# Patient Record
Sex: Female | Born: 1990 | Race: White | Hispanic: No | Marital: Married | Smoking: Former smoker
Health system: Southern US, Community
[De-identification: ages and names within clinical notes are randomized; demographics above are authoritative.]

## PROBLEM LIST (undated history)

## (undated) DIAGNOSIS — F419 Anxiety disorder, unspecified: Secondary | ICD-10-CM

## (undated) DIAGNOSIS — Z8489 Family history of other specified conditions: Secondary | ICD-10-CM

## (undated) DIAGNOSIS — O139 Gestational [pregnancy-induced] hypertension without significant proteinuria, unspecified trimester: Secondary | ICD-10-CM

## (undated) DIAGNOSIS — F909 Attention-deficit hyperactivity disorder, unspecified type: Secondary | ICD-10-CM

## (undated) DIAGNOSIS — O24419 Gestational diabetes mellitus in pregnancy, unspecified control: Secondary | ICD-10-CM

## (undated) DIAGNOSIS — K219 Gastro-esophageal reflux disease without esophagitis: Secondary | ICD-10-CM

## (undated) DIAGNOSIS — T8859XA Other complications of anesthesia, initial encounter: Secondary | ICD-10-CM

## (undated) HISTORY — PX: ANKLE SURGERY: SHX546

## (undated) HISTORY — DX: Gestational (pregnancy-induced) hypertension without significant proteinuria, unspecified trimester: O13.9

## (undated) HISTORY — PX: OTHER SURGICAL HISTORY: SHX169

## (undated) HISTORY — PX: TONSILLECTOMY: SUR1361

## (undated) HISTORY — PX: LITHOTRIPSY: SUR834

## (undated) HISTORY — DX: Other complications of anesthesia, initial encounter: T88.59XA

---
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
## 2010-07-01 ENCOUNTER — Emergency Department (HOSPITAL_COMMUNITY): Admission: EM | Admit: 2010-07-01 | Discharge: 2010-07-01 | Payer: Self-pay | Admitting: Emergency Medicine

## 2011-02-03 ENCOUNTER — Emergency Department (HOSPITAL_COMMUNITY)
Admission: EM | Admit: 2011-02-03 | Discharge: 2011-02-03 | Disposition: A | Discharge status: Home / Self Care | Payer: Medicaid Other | Attending: Emergency Medicine | Admitting: Emergency Medicine

## 2011-02-03 DIAGNOSIS — R109 Unspecified abdominal pain: Secondary | ICD-10-CM | POA: Insufficient documentation

## 2011-02-03 DIAGNOSIS — R197 Diarrhea, unspecified: Secondary | ICD-10-CM | POA: Insufficient documentation

## 2011-02-03 LAB — DIFFERENTIAL
Basophils Relative: 0 % (ref 0–1)
Eosinophils Relative: 1 % (ref 0–5)
Lymphocytes Relative: 28 % (ref 12–46)
Lymphs Abs: 2.2 10*3/uL (ref 0.7–4.0)
Monocytes Absolute: 0.4 10*3/uL (ref 0.1–1.0)
Monocytes Relative: 5 % (ref 3–12)
Neutro Abs: 5.2 10*3/uL (ref 1.7–7.7)

## 2011-02-03 LAB — URINE MICROSCOPIC-ADD ON

## 2011-02-03 LAB — COMPREHENSIVE METABOLIC PANEL
ALT: 14 U/L (ref 0–35)
AST: 19 U/L (ref 0–37)
BUN: 12 mg/dL (ref 6–23)
GFR calc non Af Amer: >60 mL/min (ref >60)
Glucose, Bld: 80 mg/dL (ref 70–99)
Total Protein: 7 g/dL (ref 6.0–8.3)

## 2011-02-03 LAB — URINALYSIS, ROUTINE W REFLEX MICROSCOPIC
Glucose, UA: NEGATIVE mg/dL
Ketones, ur: 15 mg/dL — AB
Leukocytes, UA: NEGATIVE
Nitrite: NEGATIVE
Protein, ur: NEGATIVE mg/dL
Urobilinogen, UA: 0.2 mg/dL (ref 0.0–1.0)

## 2011-02-03 LAB — CBC
HCT: 39.6 % (ref 36.0–46.0)
MCH: 27.8 pg (ref 26.0–34.0)
MCHC: 33.6 g/dL (ref 30.0–36.0)
MCV: 82.8 fL (ref 78.0–100.0)
Platelets: 272 10*3/uL (ref 150–400)
RDW: 12.5 % (ref 11.5–15.5)

## 2011-03-13 NOTE — Op Note (Signed)
NAMEMarland Kitchen  PAMLA, PANGLE            ACCOUNT NO.:  [id number]   MEDICAL RECORD NO.:  [id number]          PATIENT TYPE:  AMB   LOCATION:  DSC                          FACILITY:  [facility]   PHYSICIAN:  Alfonse Flavors, M.D.    DATE OF BIRTH:  [birth year]   DATE OF PROCEDURE:  05/13/2006  DATE OF DISCHARGE:                                 OPERATIVE REPORT   INDICATION AND JUSTIFICATION FOR PROCEDURE:  Jennifer Hebert is a 20-year-  old patient who was seen in consultation at the request of Dr. Shanon Brow  at [facility]  Jennifer Hebert had a history of airway obstruction at  night.  She had been noted to have loud snoring.  Her mother noted  intermittent apneic periods.  Jennifer Hebert awakens several times at night with a  choking sensation.  She has strep throat about twice a year.  Initial  physical examination showed 4+ tonsils.  The soft palate moved well.  The  neck had enlarged jugulodigastric lymph nodes.  Jennifer Hebert was felt to have  tonsil and adenoid hypertrophy with significant nocturnal airway  obstruction, and she was a candidate for tonsillectomy and adenoidectomy.  The indications and complications of the procedure were discussed in detail  with her mother, and our pre-permit was obtained.   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   PROCEDURE PERFORMED:  Tonsillectomy and adenoidectomy.   ANESTHESIA:  General endotracheal.   DESCRIPTION:  Jennifer Hebert was brought to the operating room and placed supine on  the operating table.  She was induced with general anesthesia and intubated  with an orotracheal tube.  The face was draped in a sterile fashion.  The  mouth was opened with a Crowe-Davis mouthgag, and the left tonsil was  grasped with a tenaculum and retracted medially.  An incision was made over  the anterior tonsillar pillar using suction cautery.   Using a curved Dean  knife, curved clamp and suction cautery, the tonsil was dissected free from  the tonsillar fossa.  Similar technique was used for removal of the right  tonsil.  The tonsillar fossae were abraded with a Pension scheme manager.  Further bleeding areas were cauterized again.  Marcaine 0.5% with 1:200,000  epinephrine was injected circumferentially around the tonsillar fossae; a  total of 3 mL of solution was used.  The palate was elevated with the red  rubber catheter.  Under visualization by mirror, a moderate adenoid was  removed with adenotome and suction cautery.  Hemostasis was obtained with  suction cautery.  The pharynx was suctioned free of debris.  A small  nasogastric tube was passed into the stomach, and the gastric contents were  evacuated.  Jennifer Hebert tolerated the procedure well and was taken to the  recovery area in satisfactory condition.   FOLLOWUP CARE:  Jennifer Hebert will be admitted to the recovery care unit for  observation, IV hydration, and IV analgesia.  If she does not have  significant nausea and is taking oral fluids well, she may be discharged  this  evening.  If she is not maintaining good oral hydration, she would be  observed overnight.  Discharge medications will include [employer] with codeine  and amoxicillin.  Jennifer Hebert will be seen for reevaluation in our office on  May 27, 2006.           ______________________________  Alfonse Flavors, M.D.     JCM/MEDQ  D:  05/13/2006  T:  05/13/2006  Job:  [id number]   cc:   Dr. [facility]

## 2011-08-03 ENCOUNTER — Other Ambulatory Visit: Payer: Self-pay | Admitting: Dermatology

## 2012-01-02 ENCOUNTER — Ambulatory Visit (INDEPENDENT_AMBULATORY_CARE_PROVIDER_SITE_OTHER): Payer: BC Managed Care – PPO | Admitting: Family Medicine

## 2012-01-02 DIAGNOSIS — B349 Viral infection, unspecified: Secondary | ICD-10-CM

## 2012-01-02 DIAGNOSIS — R69 Illness, unspecified: Secondary | ICD-10-CM

## 2012-01-02 DIAGNOSIS — J029 Acute pharyngitis, unspecified: Secondary | ICD-10-CM

## 2012-01-02 LAB — POCT CBC
Lymph, poc: 1.2 (ref 0.6–3.4)
MCH, POC: 27.7 pg (ref 27–31.2)
MCHC: 32.1 g/dL (ref 31.8–35.4)
MID (cbc): 0.4 (ref 0–0.9)
MPV: 9.4 fL (ref 0–99.8)
POC Granulocyte: 3.3 (ref 2–6.9)
POC LYMPH PERCENT: 24.4 %L (ref 10–50)
RBC: 4.87 M/uL (ref 4.04–5.48)
WBC: 4.9 10*3/uL (ref 4.6–10.2)

## 2012-01-02 LAB — POCT RAPID STREP A (OFFICE): Rapid Strep A Screen: NEGATIVE

## 2012-01-02 MED ORDER — HYDROCODONE-HOMATROPINE 5-1.5 MG/5ML PO SYRP: 5.0000 mL | ORAL_SOLUTION | Freq: Three times a day (TID) | ORAL | Status: AC | PRN

## 2012-01-02 NOTE — Patient Instructions (Addendum)
Drink lots of fluids and get plenty of rest. Take the cough syrup as ordered. If not doing better by the middle of the week we may need to antibiotic.   Viral Pharyngitis Viral pharyngitis is a viral infection that produces redness, pain, and swelling (inflammation) of the throat. It can spread from person to person (contagious). CAUSES Viral pharyngitis is caused by inhaling a large amount of certain germs called viruses. Many different viruses cause viral pharyngitis. SYMPTOMS Symptoms of viral pharyngitis include:  Sore throat.   Tiredness.   Stuffy nose.   Low-grade fever.   Congestion.   Cough.  TREATMENT Treatment includes rest, drinking plenty of fluids, and the use of over-the-counter medication (approved by your caregiver). HOME CARE INSTRUCTIONS   Drink enough fluids to keep your urine clear or pale yellow.   Eat soft, cold foods such as ice cream, frozen ice pops, or gelatin dessert.   Gargle with warm salt water (1 tsp salt per 1 qt of water).   If over age 29, throat lozenges may be used safely.   Only take over-the-counter or prescription medicines for pain, discomfort, or fever as directed by your caregiver. Do not take aspirin.  To help prevent spreading viral pharyngitis to others, avoid:  Mouth-to-mouth contact with others.   Sharing utensils for eating and drinking.   Coughing around others.  SEEK MEDICAL CARE IF:   You are better in a few days, then become worse.   You have a fever or pain not helped by pain medicines.   There are any other changes that concern you.  Document Released: 07/22/2005 Document Revised: 10/01/2011 Document Reviewed: 12/18/2010 [facility] Patient Information 2012 [place], [place].

## 2012-01-02 NOTE — Progress Notes (Signed)
Subjective: Patient reports having gotten ill on Thursday she had sore throat and bodyaches and fever over the last 2 days . Actually she did better on Friday. Today her fever is higher he can she has a headache, neck pain, back pain, sore throat, and cough. 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  She had a lot of respiratory tract infections when she was younger, but after tonsillectomy at about age 21 she has not had many such illnesses. The last one was one year ago when she had strep. She does smoke about a pack of cigarettes per week.  Objective: Cheeks are flushed. TMs normal. Throat erythematous without exudate. Neck supple without significant nodes. Chest is clear to auscultation. Heart regular without murmurs.  Assessment: Pharyngitis and cough  Plan: Check labs and proceed accordingly.  Results for orders placed in visit on 01/02/12  POCT CBC      Component Value Range   WBC 4.9  4.6 - 10.2 (K/uL)   Lymph, poc 1.2  0.6 - 3.4    POC LYMPH PERCENT 24.4  10 - 50 (%L)   MID (cbc) 0.4  0 - 0.9    POC MID % 8.3  0 - 12 (%M)   POC Granulocyte 3.3  2 - 6.9    Granulocyte percent 67.3  37 - 80 (%G)   RBC 4.87  4.04 - 5.48 (M/uL)   Hemoglobin 13.5  12.2 - 16.2 (g/dL)   HCT, POC 40.9  81.1 - 47.9 (%)   MCV 86.3  80 - 97 (fL)   MCH, POC 27.7  27 - 31.2 (pg)   MCHC 32.1  31.8 - 35.4 (g/dL)   RDW, POC 91.4     Platelet Count, POC 297  142 - 424 (K/uL)   MPV 9.4  0 - 99.8 (fL)  POCT RAPID STREP A (OFFICE)      Component Value Range   Rapid Strep A Screen Negative  Negative   POCT INFLUENZA A/B      Component Value Range   Influenza A, POC Negative     Influenza B, POC Negative     viral syndromes  Treatment symptomatic

## 2012-05-27 ENCOUNTER — Other Ambulatory Visit: Payer: Self-pay | Admitting: Dermatology

## 2012-10-11 ENCOUNTER — Other Ambulatory Visit: Payer: Self-pay | Admitting: Dermatology

## 2012-10-26 DIAGNOSIS — Z8619 Personal history of other infectious and parasitic diseases: Secondary | ICD-10-CM

## 2012-10-26 DIAGNOSIS — Z8742 Personal history of other diseases of the female genital tract: Secondary | ICD-10-CM

## 2012-10-26 HISTORY — DX: Personal history of other infectious and parasitic diseases: Z86.19

## 2012-10-26 HISTORY — DX: Personal history of other diseases of the female genital tract: Z87.42

## 2013-10-24 ENCOUNTER — Other Ambulatory Visit: Payer: Self-pay | Admitting: Dermatology

## 2015-09-25 ENCOUNTER — Emergency Department (HOSPITAL_COMMUNITY)
Admission: EM | Admit: 2015-09-25 | Discharge: 2015-09-26 | Disposition: A | Discharge status: Home / Self Care | Payer: BLUE CROSS/BLUE SHIELD | Attending: Emergency Medicine | Admitting: Emergency Medicine

## 2015-09-25 ENCOUNTER — Encounter (HOSPITAL_COMMUNITY): Payer: Self-pay | Admitting: Emergency Medicine

## 2015-09-25 DIAGNOSIS — Z79899 Other long term (current) drug therapy: Secondary | ICD-10-CM | POA: Insufficient documentation

## 2015-09-25 DIAGNOSIS — R21 Rash and other nonspecific skin eruption: Secondary | ICD-10-CM | POA: Diagnosis present

## 2015-09-25 DIAGNOSIS — L509 Urticaria, unspecified: Secondary | ICD-10-CM | POA: Insufficient documentation

## 2015-09-25 NOTE — ED Notes (Signed)
Pt states that she started breaking out into a rash tonight. Thought she might be allergic to a new shampoo. Took benadryl at approx. X 1 hour ago. Alert and oriented. Airway intact.

## 2015-09-26 MED ORDER — PREDNISONE 10 MG PO TABS: ORAL_TABLET | ORAL | Status: DC

## 2015-09-26 MED ORDER — PREDNISONE 20 MG PO TABS
60.0000 mg | ORAL_TABLET | Freq: Once | ORAL | Status: AC
  Administered 2015-09-26: 60 mg via ORAL
  Filled 2015-09-26: qty 3

## 2015-09-26 MED ORDER — DIPHENHYDRAMINE HCL 25 MG PO TABS: 25.0000 mg | ORAL_TABLET | Freq: Four times a day (QID) | ORAL | Status: DC

## 2015-09-26 MED ORDER — FAMOTIDINE 20 MG PO TABS
20.0000 mg | ORAL_TABLET | Freq: Once | ORAL | Status: AC
  Administered 2015-09-26: 20 mg via ORAL
  Filled 2015-09-26: qty 1

## 2015-09-26 MED ORDER — FAMOTIDINE 20 MG PO TABS: 20.0000 mg | ORAL_TABLET | Freq: Two times a day (BID) | ORAL | Status: DC

## 2015-09-26 NOTE — Discharge Instructions (Signed)
Hives Hives are itchy, red, swollen areas of the skin. They can vary in size and location on your body. Hives can come and go for hours or several days (acute hives) or for several weeks (chronic hives). Hives do not spread from person to person (noncontagious). They may get worse with scratching, exercise, and emotional stress. CAUSES   Allergic reaction to food, additives, or drugs.  Infections, including the common cold.  Illness, such as vasculitis, lupus, or thyroid disease.  Exposure to sunlight, heat, or cold.  Exercise.  Stress.  Contact with chemicals. SYMPTOMS   Red or white swollen patches on the skin. The patches may change size, shape, and location quickly and repeatedly.  Itching.  Swelling of the hands, feet, and face. This may occur if hives develop deeper in the skin. DIAGNOSIS  Your caregiver can usually tell what is wrong by performing a physical exam. Skin or blood tests may also be done to determine the cause of your hives. In some cases, the cause cannot be determined. TREATMENT  Mild cases usually get better with medicines such as antihistamines. Severe cases may require an emergency epinephrine injection. If the cause of your hives is known, treatment includes avoiding that trigger.  HOME CARE INSTRUCTIONS   Avoid causes that trigger your hives.  Take antihistamines as directed by your caregiver to reduce the severity of your hives. Non-sedating or low-sedating antihistamines are usually recommended. Do not drive while taking an antihistamine.  Take any other medicines prescribed for itching as directed by your caregiver.  Wear loose-fitting clothing.  Keep all follow-up appointments as directed by your caregiver. SEEK MEDICAL CARE IF:   You have persistent or severe itching that is not relieved with medicine.  You have painful or swollen joints. SEEK IMMEDIATE MEDICAL CARE IF:   You have a fever.  Your tongue or lips are swollen.  You have  trouble breathing or swallowing.  You feel tightness in the throat or chest.  You have abdominal pain. These problems may be the first sign of a life-threatening allergic reaction. Call your local emergency services (911 in [place] MAKE SURE YOU:   Understand these instructions.  Will watch your condition.  Will get help right away if you are not doing well or get worse.   This information is not intended to replace advice given to you by your health care provider. Make sure you discuss any questions you have with your health care provider.   Document Released: 10/12/2005 Document Revised: 10/17/2013 Document Reviewed: 01/05/2012 Elsevier Interactive Patient Education 2016 Elsevier Inc.  

## 2015-09-26 NOTE — ED Provider Notes (Signed)
CSN: [id number]     Arrival date & time 09/25/15  2349 History   First MD Initiated Contact with Patient 09/26/15 0001     Chief Complaint  Patient presents with  . Urticaria     (Consider location/radiation/quality/duration/timing/severity/associated sxs/prior Treatment) Patient is a 24 y.o. female presenting with urticaria. The history is provided by the patient. No language interpreter was used.  Urticaria This is a new problem. The current episode started today. Associated symptoms include a rash. Pertinent negatives include no chest pain, coughing, fever or nausea. Associated symptoms comments: Onset of generalized, itching rash earlier today while at work. No new environments, change in soap or detergents, new medications.  No SOB or difficulty swallowing. No history of allergic reactions in the past..    No past medical history on file. Past Surgical History  Procedure Laterality Date  . Ankle surgery Right   . Tonsillectomy     No family history on file. Social History  Substance Use Topics  . Smoking status: Never Smoker   . Smokeless tobacco: Not on file  . Alcohol Use: Yes   OB History    No data available     Review of Systems  Constitutional: Negative for fever.  HENT: Negative for facial swelling and trouble swallowing.   Respiratory: Negative for cough and wheezing.   Cardiovascular: Negative for chest pain.  Gastrointestinal: Negative for nausea.  Skin: Positive for rash.  Neurological: Negative for dizziness and light-headedness.      Allergies  Review of patient's allergies indicates no known allergies.  Home Medications   Prior to Admission medications   Medication Sig Start Date End Date Taking? Authorizing Provider  cloNIDine (CATAPRES) 0.1 MG tablet Take 0.1 mg by mouth as needed.    Historical Provider, MD  diphenhydrAMINE (BENADRYL) 25 MG tablet Take 1 tablet (25 mg total) by mouth every 6 (six) hours. 09/26/15   Elpidio Anis, PA-C   etonogestrel (NEXPLANON) 68 MG IMPL implant Inject 1 each into the skin once.    Historical Provider, MD  famotidine (PEPCID) 20 MG tablet Take 1 tablet (20 mg total) by mouth 2 (two) times daily. 09/26/15   Elpidio Anis, PA-C  lisdexamfetamine (VYVANSE) 60 MG capsule Take 60 mg by mouth every morning.    Historical Provider, MD  predniSONE (DELTASONE) 10 MG tablet Take 5 on day 2 (day one provided in the emergency department) Take 4 on day 3 Take 3 on day 4 Take 2 on day 5 Take 1 on day 6 09/26/15   Melvenia Beam Dayanara Sherrill, PA-C   BP 163/89 mmHg  Pulse 104  Temp(Src) 98.1 F (36.7 C) (Oral)  SpO2 98%  LMP 09/20/2015 Physical Exam  Constitutional: She is oriented to person, place, and time. She appears well-developed and well-nourished.  HENT:  Head: Normocephalic.  Lips without swelling.  Neck: Normal range of motion. Neck supple.  Cardiovascular: Normal rate and regular rhythm.   Pulmonary/Chest: Effort normal and breath sounds normal. She has no wheezes. She has no rales.  Abdominal: Soft. Bowel sounds are normal. There is no tenderness. There is no rebound and no guarding.  Musculoskeletal: Normal range of motion.  Neurological: She is alert and oriented to person, place, and time.  Skin: Skin is warm and dry. No rash noted.  Raised, confluent, erythematous rash in generalized distribution, c/w hives.  Psychiatric: She has a normal mood and affect.    ED Course  Procedures (including critical care time) Labs Review Labs Reviewed - No data  to display  Imaging Review No results found. I have personally reviewed and evaluated these images and lab results as part of my medical decision-making.   EKG Interpretation None      MDM   Final diagnoses:  Hives    Uncomplicated hives requiring medications. Return precautions discussed. Dosing of Benadryl also discussed.     Elpidio AnisShari Mackensie Pilson, PA-C 09/26/15 0040  Tomasita CrumbleAdeleke Oni, MD 09/26/15 [phone]

## 2016-03-13 ENCOUNTER — Emergency Department (HOSPITAL_COMMUNITY): Payer: BLUE CROSS/BLUE SHIELD

## 2016-03-13 ENCOUNTER — Ambulatory Visit (INDEPENDENT_AMBULATORY_CARE_PROVIDER_SITE_OTHER): Payer: BLUE CROSS/BLUE SHIELD

## 2016-03-13 ENCOUNTER — Emergency Department (HOSPITAL_COMMUNITY)
Admission: EM | Admit: 2016-03-13 | Discharge: 2016-03-13 | Disposition: A | Discharge status: Home / Self Care | Payer: BLUE CROSS/BLUE SHIELD | Attending: Emergency Medicine | Admitting: Emergency Medicine

## 2016-03-13 ENCOUNTER — Ambulatory Visit (INDEPENDENT_AMBULATORY_CARE_PROVIDER_SITE_OTHER): Payer: BLUE CROSS/BLUE SHIELD | Admitting: Physician Assistant

## 2016-03-13 ENCOUNTER — Encounter (HOSPITAL_COMMUNITY): Payer: Self-pay | Admitting: Emergency Medicine

## 2016-03-13 VITALS — BP 152/88 | HR 89 | Temp 98.6°F | Ht 65.0 in | Wt 218.0 lb

## 2016-03-13 DIAGNOSIS — R109 Unspecified abdominal pain: Secondary | ICD-10-CM | POA: Insufficient documentation

## 2016-03-13 DIAGNOSIS — N200 Calculus of kidney: Secondary | ICD-10-CM

## 2016-03-13 DIAGNOSIS — N12 Tubulo-interstitial nephritis, not specified as acute or chronic: Secondary | ICD-10-CM

## 2016-03-13 DIAGNOSIS — Z79899 Other long term (current) drug therapy: Secondary | ICD-10-CM | POA: Diagnosis not present

## 2016-03-13 DIAGNOSIS — Z87891 Personal history of nicotine dependence: Secondary | ICD-10-CM | POA: Insufficient documentation

## 2016-03-13 DIAGNOSIS — N23 Unspecified renal colic: Secondary | ICD-10-CM

## 2016-03-13 LAB — URINALYSIS, ROUTINE W REFLEX MICROSCOPIC
Bilirubin Urine: NEGATIVE
GLUCOSE, UA: NEGATIVE mg/dL
KETONES UR: 15 mg/dL — AB
NITRITE: NEGATIVE
PH: 6 (ref 5.0–8.0)
Protein, ur: 100 mg/dL — AB
SPECIFIC GRAVITY, URINE: 1.031 — AB (ref 1.005–1.030)

## 2016-03-13 LAB — POCT URINALYSIS DIP (MANUAL ENTRY)
Bilirubin, UA: NEGATIVE
Glucose, UA: NEGATIVE
NITRITE UA: NEGATIVE
PH UA: 5.5
Spec Grav, UA: >=1.03
Urobilinogen, UA: 0.2

## 2016-03-13 LAB — CBC WITH DIFFERENTIAL/PLATELET
BASOS ABS: 0 10*3/uL (ref 0.0–0.1)
BASOS PCT: 0 %
EOS ABS: 0.1 10*3/uL (ref 0.0–0.7)
Eosinophils Relative: 1 %
HCT: 37.2 % (ref 36.0–46.0)
HEMOGLOBIN: 12.9 g/dL (ref 12.0–15.0)
Lymphocytes Relative: 36 %
Lymphs Abs: 3.3 10*3/uL (ref 0.7–4.0)
MCH: 28.7 pg (ref 26.0–34.0)
MCHC: 34.7 g/dL (ref 30.0–36.0)
MCV: 82.9 fL (ref 78.0–100.0)
Monocytes Absolute: 0.4 10*3/uL (ref 0.1–1.0)
Monocytes Relative: 5 %
NEUTROS PCT: 58 %
Neutro Abs: 5.4 10*3/uL (ref 1.7–7.7)
Platelets: 278 10*3/uL (ref 150–400)
RBC: 4.49 MIL/uL (ref 3.87–5.11)
RDW: 12.8 % (ref 11.5–15.5)
WBC: 9.3 10*3/uL (ref 4.0–10.5)

## 2016-03-13 LAB — POCT CBC
Granulocyte percent: 70.4 %G (ref 37–80)
HCT, POC: 37.6 % — AB (ref 37.7–47.9)
HEMOGLOBIN: 13.5 g/dL (ref 12.2–16.2)
LYMPH, POC: 2.5 (ref 0.6–3.4)
MCH, POC: 29.8 pg (ref 27–31.2)
MCHC: 35.9 g/dL — AB (ref 31.8–35.4)
MCV: 83 fL (ref 80–97)
MID (cbc): 0.3 (ref 0–0.9)
MPV: 7.9 fL (ref 0–99.8)
POC GRANULOCYTE: 6.8 (ref 2–6.9)
POC LYMPH PERCENT: 26.1 %L (ref 10–50)
POC MID %: 3.5 %M (ref 0–12)
Platelet Count, POC: 271 10*3/uL (ref 142–424)
RBC: 4.53 M/uL (ref 4.04–5.48)
RDW, POC: 13.1 %
WBC: 9.6 10*3/uL (ref 4.6–10.2)

## 2016-03-13 LAB — BASIC METABOLIC PANEL
Anion gap: 7 (ref 5–15)
BUN: 13 mg/dL (ref 6–20)
CHLORIDE: 112 mmol/L — AB (ref 101–111)
CO2: 21 mmol/L — ABNORMAL LOW (ref 22–32)
CREATININE: 0.63 mg/dL (ref 0.44–1.00)
Calcium: 8.8 mg/dL — ABNORMAL LOW (ref 8.9–10.3)
GFR calc non Af Amer: >60 mL/min (ref >60)
Glucose, Bld: 91 mg/dL (ref 65–99)
POTASSIUM: 3.8 mmol/L (ref 3.5–5.1)
SODIUM: 140 mmol/L (ref 135–145)

## 2016-03-13 LAB — URINE MICROSCOPIC-ADD ON

## 2016-03-13 LAB — POCT URINE PREGNANCY: Preg Test, Ur: NEGATIVE

## 2016-03-13 LAB — I-STAT BETA HCG BLOOD, ED (MC, WL, AP ONLY): I-stat hCG, quantitative: <5 m[IU]/mL (ref <5)

## 2016-03-13 MED ORDER — CEPHALEXIN 500 MG PO CAPS: 500.0000 mg | ORAL_CAPSULE | Freq: Three times a day (TID) | ORAL | Status: DC

## 2016-03-13 MED ORDER — MORPHINE SULFATE (PF) 4 MG/ML IV SOLN
4.0000 mg | Freq: Once | INTRAVENOUS | Status: AC
  Administered 2016-03-13: 4 mg via INTRAVENOUS
  Filled 2016-03-13: qty 1

## 2016-03-13 MED ORDER — HYDROCODONE-ACETAMINOPHEN 5-325 MG PO TABS: 1.0000 | ORAL_TABLET | Freq: Four times a day (QID) | ORAL | Status: DC | PRN

## 2016-03-13 MED ORDER — SODIUM CHLORIDE 0.9 % IV BOLUS (SEPSIS)
1000.0000 mL | Freq: Once | INTRAVENOUS | Status: AC
  Administered 2016-03-13: 1000 mL via INTRAVENOUS

## 2016-03-13 MED ORDER — ONDANSETRON HCL 4 MG/2ML IJ SOLN
4.0000 mg | Freq: Once | INTRAMUSCULAR | Status: AC
  Administered 2016-03-13: 4 mg via INTRAVENOUS
  Filled 2016-03-13: qty 2

## 2016-03-13 MED ORDER — KETOROLAC TROMETHAMINE 60 MG/2ML IM SOLN
60.0000 mg | Freq: Once | INTRAMUSCULAR | Status: AC
  Administered 2016-03-13: 60 mg via INTRAMUSCULAR

## 2016-03-13 MED ORDER — ONDANSETRON 4 MG PO TBDP: ORAL_TABLET | ORAL | Status: DC

## 2016-03-13 MED ORDER — DEXTROSE 5 % IV SOLN
1.0000 g | Freq: Once | INTRAVENOUS | Status: AC
  Administered 2016-03-13: 1 g via INTRAVENOUS
  Filled 2016-03-13: qty 10

## 2016-03-13 NOTE — Patient Instructions (Addendum)
Report to [facility] ED.  Nothing to eat or drink.    IF you received an x-ray today, you will receive an invoice from [facility]. Please contact [facility] at [phone] with questions or concerns regarding your invoice.   IF you received labwork today, you will receive an invoice from [facility]. Please contact [facility] at [phone] with questions or concerns regarding your invoice.   Our billing staff will not be able to assist you with questions regarding bills from these companies.  You will be contacted with the lab results as soon as they are available. The fastest way to get your results is to activate your My Chart account. Instructions are located on the last page of this paperwork. If you have not heard from us regarding the results in 2 weeks, please contact this office.

## 2016-03-13 NOTE — ED Notes (Signed)
[facility] called to notify of a 23 mm Kidney stone right coming via private vehicle

## 2016-03-13 NOTE — Progress Notes (Signed)
03/13/2016 4:56 PM   DOB: 07/04/1991 / MRN: [id number]  SUBJECTIVE:  Jennifer Hebert is a 25 y.o. female presenting for right sided flank pain that has been present for roughly 1 year but worse in the last 4 days.  Reports the pain has been so severe that she has thrown up twice.  Movement does make the pain worse.  She denies dysuria, urgency, frequency, and is moving her bowels normally. She denies a history of abdominal surgery.   She has been worked up for this in the past and no problem was found, and work up included GYN.   She has No Known Allergies.   She  has no past medical history on file.    She  reports that she quit smoking about 8 months ago. She has never used smokeless tobacco. She reports that she drinks alcohol. She reports that she does not use illicit drugs. She  has no sexual activity history on file. The patient  has past surgical history that includes Ankle surgery (Right) and Tonsillectomy.  Her family history is not on file.  Review of Systems  Constitutional: Negative for fever and chills.  Eyes: Negative for blurred vision.  Respiratory: Negative for cough and shortness of breath.   Cardiovascular: Negative for chest pain.  Gastrointestinal: Positive for nausea, vomiting and abdominal pain.  Genitourinary: Negative for dysuria, urgency and frequency.  Musculoskeletal: Negative for myalgias.  Skin: Negative for rash.  Neurological: Negative for dizziness, tingling and headaches.  Psychiatric/Behavioral: Negative for depression. The patient is not nervous/anxious.     Problem list and medications reviewed and updated by myself where necessary, and exist elsewhere in the encounter.   OBJECTIVE:  BP 152/88 mmHg  Pulse 89  Temp(Src) 98.6 F (37 C) (Oral)  Ht  (1.651 m)  Wt 218 lb (98.884 kg)  BMI 36.28 kg/m2  SpO2 97%  LMP 02/03/2016 (Exact Date)  Physical Exam  Constitutional: She is oriented to person, place, and time. She appears  well-nourished. No distress.  Eyes: EOM are normal. Pupils are equal, round, and reactive to light.  Cardiovascular: Normal rate.   Pulmonary/Chest: Effort normal.  Abdominal: She exhibits no distension and no mass. There is tenderness (left upper and lower quadrant). There is no rebound, no guarding and no CVA tenderness.  Musculoskeletal:       Back:  Neurological: She is alert and oriented to person, place, and time. She has normal strength. No cranial nerve deficit or sensory deficit. Coordination and gait normal.  Reflex Scores:      Patellar reflexes are 2+ on the right side and 2+ on the left side. Skin: Skin is dry. She is not diaphoretic.  Psychiatric: She has a normal mood and affect.  Vitals reviewed.   Results for orders placed or performed in visit on 03/13/16 (from the past 72 hour(s))  POCT CBC     Status: Abnormal   Collection Time: 03/13/16  2:02 PM  Result Value Ref Range   WBC 9.6 4.6 - 10.2 K/uL   Lymph, poc 2.5 0.6 - 3.4   POC LYMPH PERCENT 26.1 10 - 50 %L   MID (cbc) 0.3 0 - 0.9   POC MID % 3.5 0 - 12 %M   POC Granulocyte 6.8 2 - 6.9   Granulocyte percent 70.4 37 - 80 %G   RBC 4.53 4.04 - 5.48 M/uL   Hemoglobin 13.5 12.2 - 16.2 g/dL   HCT, POC 78.2 (A) 95.6 - 47.9 %  MCV 83.0 80 - 97 fL   MCH, POC 29.8 27 - 31.2 pg   MCHC 35.9 (A) 31.8 - 35.4 g/dL   RDW, POC 16.113.1 %   Platelet Count, POC 271 142 - 424 K/uL   MPV 7.9 0 - 99.8 fL  POCT urinalysis dipstick     Status: Abnormal   Collection Time: 03/13/16  2:12 PM  Result Value Ref Range   Color, UA yellow yellow   Clarity, UA cloudy (A) clear   Glucose, UA negative negative   Bilirubin, UA negative negative   Ketones, POC UA trace (5) (A) negative   Spec Grav, UA >=1.030    Blood, UA moderate (A) negative   pH, UA 5.5    Protein Ur, POC >=300 (A) negative   Urobilinogen, UA 0.2    Nitrite, UA Negative Negative   Leukocytes, UA small (1+) (A) Negative  POCT urine pregnancy     Status: None    Collection Time: 03/13/16  2:12 PM  Result Value Ref Range   Preg Test, Ur Negative Negative    Dg Abd 2 Views  03/13/2016  CLINICAL DATA:  RIGHT flank pain 4 days. EXAM: ABDOMEN - 2 VIEW COMPARISON:  None. FINDINGS: There is a large calcification projecting over the expected location of the RIGHT renal hilum measuring 23 mm. No ureterolithiasis or bladder calculi. Normal bowel-gas pattern IMPRESSION: Suspect large RIGHT renal pelvis calculus. These results will be called to the ordering clinician or representative by the Radiologist Assistant, and communication documented in the PACS or zVision Dashboard. Electronically Signed   By: Genevive BiStewart  Edmunds M.D.   On: 03/13/2016 14:17    Wt Readings from Last 3 Encounters:  03/13/16 218 lb (98.884 kg)  01/02/12 191 lb (86.637 kg)     ASSESSMENT AND PLAN  Jennifer Hebert was seen today for flank pain and nausea.  Diagnoses and all orders for this visit:  Flank pain: She has a very large stone about the right renal hilum.  There is blood in her urine. She has had multiple episodes of emesis.  I have contacted urology who advised that she present to the ED for further imaging and management. We have given her 1.5 liters of NS and 60 mg of Toradol IM.  Will send [facility] to [facility] for further eval.  -     POCT CBC -     POCT urinalysis dipstick -     DG Abd 2 Views; Future -     POCT urine pregnancy    The patient was advised to call or return to clinic if she does not see an improvement in symptoms or to seek the care of the closest emergency department if she worsens with the above plan.   Deliah BostonMichael Ernesha Ramone, MHS, PA-C [facility] 03/13/2016 4:56 PM

## 2016-03-13 NOTE — Discharge Instructions (Signed)
Stay hydrated.   Take zofran for nausea.   Take vicodin for severe pain. Do not drive with it.   Take motrin 800 mg every 6 hrs for mild pain.   Take keflex three times daily for a week.   See your doctor   Return to ER if you have worse pain, vomiting, fevers.

## 2016-03-13 NOTE — ED Provider Notes (Addendum)
CSN: [id number]     Arrival date & time 03/13/16  1554 History   First MD Initiated Contact with Patient 03/13/16 1846     Chief Complaint  Patient presents with  . Nephrolithiasis     (Consider location/radiation/quality/duration/timing/severity/associated sxs/prior Treatment) The history is provided by the patient.  Lelon HuhConnor B Moroz is a 25 y.o. female history of kidney stones here presenting with right flank pain. Patient has right flank pain intermittently for the last year or so. [place] to school in [place] and came home for the summer. She works in a [employer] relations [employer] and has been out in the sun for the last several days. She had an episode of vomiting yesterday and today. She went to urgent care today and had an x-ray that showed possible 23 mm R kidney stone and sent for evaluation. Denies any gross hematuria or dysuria or vaginal discharge. Has history of kidney stone but has no previous surgery for it.     History reviewed. No pertinent past medical history. Past Surgical History  Procedure Laterality Date  . Ankle surgery Right   . Tonsillectomy     History reviewed. No pertinent family history. Social History  Substance Use Topics  . Smoking status: Former Smoker    Quit date: 07/01/2015  . Smokeless tobacco: Never Used  . Alcohol Use: 0.0 oz/week    0 Standard drinks or equivalent per week   OB History    No data available     Review of Systems  Genitourinary: Positive for flank pain.  All other systems reviewed and are negative.     Allergies  Review of patient's allergies indicates no known allergies.  Home Medications   Prior to Admission medications   Medication Sig Start Date End Date Taking? Authorizing Provider  Ibuprofen-Diphenhydramine HCl (ADVIL PM) 200-25 MG CAPS Take 1 capsule by mouth at bedtime as needed (pain).   Yes Historical Provider, MD  JUNEL 1.5/30 1.5-30 MG-MCG tablet Take 1 tablet by mouth daily.  03/05/16  Yes Historical Provider,  MD  diphenhydrAMINE (BENADRYL) 25 MG tablet Take 1 tablet (25 mg total) by mouth every 6 (six) hours. Patient not taking: Reported on 03/13/2016 09/26/15   Elpidio AnisShari Upstill, PA-C   BP 145/95 mmHg  Pulse 73  Temp(Src) 98.9 F (37.2 C) (Oral)  Resp 16  SpO2 98%  LMP 02/03/2016 (Exact Date) Physical Exam  Constitutional: She is oriented to person, place, and time. She appears well-developed and well-nourished.  HENT:  Head: Normocephalic.  Mouth/Throat: Oropharynx is clear and moist.  Eyes: Conjunctivae are normal. Pupils are equal, round, and reactive to light.  Neck: Normal range of motion.  Cardiovascular: Normal rate, regular rhythm and normal heart sounds.   Pulmonary/Chest: Effort normal and breath sounds normal. No respiratory distress. She has no wheezes. She has no rales.  Abdominal: Soft. Bowel sounds are normal.  Mild R CVAT   Musculoskeletal: Normal range of motion. She exhibits no edema or tenderness.  Neurological: She is alert and oriented to person, place, and time. No cranial nerve deficit. Coordination normal.  Skin: Skin is warm and dry.  Psychiatric: She has a normal mood and affect. Her behavior is normal. Thought content normal.  Nursing note and vitals reviewed.   ED Course  Procedures (including critical care time) Labs Review Labs Reviewed  BASIC METABOLIC PANEL - Abnormal; Notable for the following:    Chloride 112 (*)    CO2 21 (*)    Calcium 8.8 (*)  All other components within normal limits  URINALYSIS, ROUTINE W REFLEX MICROSCOPIC (NOT AT [facility]) - Abnormal; Notable for the following:    Color, Urine AMBER (*)    APPearance CLOUDY (*)    Specific Gravity, Urine 1.031 (*)    Hgb urine dipstick LARGE (*)    Ketones, ur 15 (*)    Protein, ur 100 (*)    Leukocytes, UA MODERATE (*)    All other components within normal limits  URINE MICROSCOPIC-ADD ON - Abnormal; Notable for the following:    Squamous Epithelial / LPF 6-30 (*)    Bacteria, UA FEW (*)     All other components within normal limits  URINE CULTURE  CBC WITH DIFFERENTIAL/PLATELET  I-STAT BETA HCG BLOOD, ED ([facility], [facility], [facility] ONLY)    Imaging Review Dg Abd 2 Views  03/13/2016  CLINICAL DATA:  RIGHT flank pain 4 days. EXAM: ABDOMEN - 2 VIEW COMPARISON:  None. FINDINGS: There is a large calcification projecting over the expected location of the RIGHT renal hilum measuring 23 mm. No ureterolithiasis or bladder calculi. Normal bowel-gas pattern IMPRESSION: Suspect large RIGHT renal pelvis calculus. These results will be called to the ordering clinician or representative by the Radiologist Assistant, and communication documented in the PACS or zVision Dashboard. Electronically Signed   By: Genevive Bi M.D.   On: 03/13/2016 14:17   Ct Renal Stone Study  03/13/2016  CLINICAL DATA:  Renal calculus on abdominal radiograph EXAM: CT ABDOMEN AND PELVIS WITHOUT CONTRAST TECHNIQUE: Multidetector CT imaging of the abdomen and pelvis was performed following the standard protocol without IV contrast. COMPARISON:  None. FINDINGS: Lower chest:  Lung bases are clear. Hepatobiliary: Moderate hepatic steatosis with areas of focal fatty sparing. Gallbladder is unremarkable. No intrahepatic extrahepatic ductal dilatation. Pancreas: Within normal limits. Spleen: Within normal limits. Adrenals/Urinary Tract: Adrenal glands are within normal limits. 1.5 x 2.4 cm calculus in the right renal pelvis.  No hydronephrosis. 4 mm nonobstructing calculus in the left lower kidney (series 2/ image 37). No hydronephrosis. Bladder is within normal limits. Stomach/Bowel: Stomach is within normal limits. No evidence of bowel obstruction. Normal appendix (series 2/ image 45). Vascular/Lymphatic: No evidence of abdominal aortic aneurysm. No suspicious abdominopelvic lymphadenopathy. Reproductive: Uterus is within normal limits. Bilateral ovaries are within normal limits. Other: No abdominopelvic ascites. Musculoskeletal: Visualized  osseous structures are within normal limits. IMPRESSION: 1.5 x 2.4 cm calculus in the right renal pelvis, corresponding to the radiographic abnormality. No hydronephrosis. Additional 4 mm nonobstructing left lower pole renal calculus. Moderate hepatic steatosis. Electronically Signed   By: Charline Bills M.D.   On: 03/13/2016 19:50   I have personally reviewed and evaluated these images and lab results as part of my medical decision-making.   EKG Interpretation None      MDM   Final diagnoses:  None   NZINGA FERRAN is a 25 y.o. female here with R flank pain. Has possible intra renal stone on xray at urgent care. Will get CT renal stone. Will give pain meds.   9:47 PM CT showed 1.5 x 2.4 cm R renal stone, no hydro. UA + UTI. Likely mild pyelo. Given ceftriaxone, will dc home with keflex. WBC nl, afebrile. Doesn't appear septic. I think likely symptoms from pyelo. Discussed with urologist, Dr. Karleen Hampshire, who agrees with plan and wants patient to call office on Monday.     Richardean Canal, MD 03/13/16 2148  Richardean Canal, MD 03/13/16 2149

## 2016-03-13 NOTE — ED Notes (Signed)
Hx of kidney stones, from urgent care on [address]. Has a 23 mm kidney stone on her right side. Blood not visible in urine. Patient doesn't appear to be in obvious distress in triage.

## 2016-03-15 LAB — URINE CULTURE

## 2016-03-18 ENCOUNTER — Other Ambulatory Visit: Payer: Self-pay | Admitting: Urology

## 2016-04-02 NOTE — Progress Notes (Signed)
Please place orders in EPIC as patient has a Pre-op appointment on 04/20/2016 at 1000 am! Thank you!

## 2016-04-16 NOTE — Patient Instructions (Addendum)
Jennifer Hebert  04/16/2016   Your procedure is scheduled on: 04/22/2016    Report to [facility]  Entrance take [place]  elevators to 3rd floor to  Short Stay Center at    0800 AM.  Call this number if you have problems the morning of surgery [phone]   Remember: ONLY 1 PERSON MAY GO WITH YOU TO SHORT STAY TO GET  READY MORNING OF YOUR SURGERY.  Do not eat food or drink liquids :After Midnight.              CLEAR LIQUID DIET DAY BEFORE SURGERY               MAGNESIUM CITRATE - 1BOTTLE BY 12 NOON DAY BEFORE SURGERY     Take these medicines the morning of surgery with A SIP OF WATER: Hydrocodone if needed, zofran if needed                                You may not have any metal on your body including hair pins and              piercings  Do not wear jewelry, make-up, lotions, powders or perfumes, deodorant             Do not wear nail polish.  Do not shave  48 hours prior to surgery.                Do not bring valuables to the hospital. [facility] IS NOT             RESPONSIBLE   FOR VALUABLES.  Contacts, dentures or bridgework may not be worn into surgery.  Leave suitcase in the car. After surgery it may be brought to your room.         Special Instructions: coughing and deep breathing exercises, leg exercises               Please read over the following fact sheets you were given: _____________________________________________________________________                CLEAR LIQUID DIET   Foods Allowed                                                                     Foods Excluded  Coffee and tea, regular and decaf                             liquids that you cannot  Plain Jell-O in any flavor                                             see through such as: Fruit ices (not with fruit pulp)                                     milk,  soups, orange juice  Iced Popsicles                                    All solid food Carbonated  beverages, regular and diet                                    Cranberry, grape and apple juices Sports drinks like Gatorade Lightly seasoned clear broth or consume(fat free) Sugar, honey syrup  Sample Menu Breakfast                                Lunch                                     Supper Cranberry juice                    Beef broth                            Chicken broth Jell-O                                     Grape juice                           Apple juice Coffee or tea                        Jell-O                                      Popsicle                                                Coffee or tea                        Coffee or tea  _____________________________________________________________________  [facility] - Preparing for Surgery Before surgery, you can play an important role.  Because skin is not sterile, your skin needs to be as free of germs as possible.  You can reduce the number of germs on your skin by washing with CHG (chlorahexidine gluconate) soap before surgery.  CHG is an antiseptic cleaner which kills germs and bonds with the skin to continue killing germs even after washing. Please DO NOT use if you have an allergy to CHG or antibacterial soaps.  If your skin becomes reddened/irritated stop using the CHG and inform your nurse when you arrive at Short Stay. Do not shave (including legs and underarms) for at least 48 hours prior to the first CHG shower.  You may shave your face/neck. Please follow these instructions carefully:  1.  Shower with CHG Soap the night before surgery and the  morning of Surgery.  2.  If you choose to wash  your hair, wash your hair first as usual with your  normal  shampoo.  3.  After you shampoo, rinse your hair and body thoroughly to remove the  shampoo.                           4.  Use CHG as you would any other liquid soap.  You can apply chg directly  to the skin and wash                       Gently with a scrungie or  clean washcloth.  5.  Apply the CHG Soap to your body ONLY FROM THE NECK DOWN.   Do not use on face/ open                           Wound or open sores. Avoid contact with eyes, ears mouth and genitals (private parts).                       Wash face,  Genitals (private parts) with your normal soap.             6.  Wash thoroughly, paying special attention to the area where your surgery  will be performed.  7.  Thoroughly rinse your body with warm water from the neck down.  8.  DO NOT shower/wash with your normal soap after using and rinsing off  the CHG Soap.                9.  Pat yourself dry with a clean towel.            10.  Wear clean pajamas.            11.  Place clean sheets on your bed the night of your first shower and do not  sleep with pets. Day of Surgery : Do not apply any lotions/deodorants the morning of surgery.  Please wear clean clothes to the hospital/surgery center.  FAILURE TO FOLLOW THESE INSTRUCTIONS MAY RESULT IN THE CANCELLATION OF YOUR SURGERY PATIENT SIGNATURE_________________________________  NURSE SIGNATURE__________________________________  ________________________________________________________________________

## 2016-04-20 ENCOUNTER — Encounter (HOSPITAL_COMMUNITY): Payer: Self-pay

## 2016-04-20 ENCOUNTER — Encounter (HOSPITAL_COMMUNITY)
Admission: RE | Admit: 2016-04-20 | Discharge: 2016-04-20 | Disposition: A | Discharge status: Home / Self Care | Payer: BLUE CROSS/BLUE SHIELD | Source: Ambulatory Visit | Attending: Urology | Admitting: Urology

## 2016-04-20 HISTORY — DX: Gastro-esophageal reflux disease without esophagitis: K21.9

## 2016-04-20 HISTORY — DX: Family history of other specified conditions: Z84.89

## 2016-04-20 HISTORY — DX: Anxiety disorder, unspecified: F41.9

## 2016-04-20 LAB — CBC
HEMATOCRIT: 40.2 % (ref 36.0–46.0)
HEMOGLOBIN: 13.7 g/dL (ref 12.0–15.0)
MCH: 28.4 pg (ref 26.0–34.0)
MCHC: 34.1 g/dL (ref 30.0–36.0)
MCV: 83.4 fL (ref 78.0–100.0)
Platelets: 310 10*3/uL (ref 150–400)
RBC: 4.82 MIL/uL (ref 3.87–5.11)
RDW: 12.5 % (ref 11.5–15.5)
WBC: 7.3 10*3/uL (ref 4.0–10.5)

## 2016-04-20 LAB — HCG, SERUM, QUALITATIVE: Preg, Serum: NEGATIVE

## 2016-04-21 MED ORDER — DEXTROSE 5 % IV SOLN
360.0000 mg | INTRAVENOUS | Status: AC
  Administered 2016-04-22: 360 mg via INTRAVENOUS
  Filled 2016-04-21 (×2): qty 9

## 2016-04-22 ENCOUNTER — Inpatient Hospital Stay (HOSPITAL_COMMUNITY): Payer: BLUE CROSS/BLUE SHIELD

## 2016-04-22 ENCOUNTER — Inpatient Hospital Stay (HOSPITAL_COMMUNITY)
Admission: RE | Admit: 2016-04-22 | Discharge: 2016-04-25 | DRG: 660 | Disposition: A | Discharge status: Home / Self Care | Payer: BLUE CROSS/BLUE SHIELD | Source: Ambulatory Visit | Attending: Urology | Admitting: Urology

## 2016-04-22 ENCOUNTER — Inpatient Hospital Stay (HOSPITAL_COMMUNITY): Payer: BLUE CROSS/BLUE SHIELD | Admitting: Anesthesiology

## 2016-04-22 ENCOUNTER — Encounter (HOSPITAL_COMMUNITY): Payer: Self-pay | Admitting: Anesthesiology

## 2016-04-22 ENCOUNTER — Encounter (HOSPITAL_COMMUNITY)
Admission: RE | Disposition: A | Discharge status: Home / Self Care | Payer: Self-pay | Source: Ambulatory Visit | Attending: Urology

## 2016-04-22 DIAGNOSIS — N132 Hydronephrosis with renal and ureteral calculous obstruction: Secondary | ICD-10-CM | POA: Diagnosis present

## 2016-04-22 DIAGNOSIS — K219 Gastro-esophageal reflux disease without esophagitis: Secondary | ICD-10-CM | POA: Diagnosis present

## 2016-04-22 DIAGNOSIS — N200 Calculus of kidney: Secondary | ICD-10-CM | POA: Diagnosis present

## 2016-04-22 DIAGNOSIS — Z85828 Personal history of other malignant neoplasm of skin: Secondary | ICD-10-CM

## 2016-04-22 DIAGNOSIS — E669 Obesity, unspecified: Secondary | ICD-10-CM | POA: Diagnosis present

## 2016-04-22 DIAGNOSIS — R112 Nausea with vomiting, unspecified: Secondary | ICD-10-CM | POA: Diagnosis not present

## 2016-04-22 DIAGNOSIS — Z419 Encounter for procedure for purposes other than remedying health state, unspecified: Secondary | ICD-10-CM

## 2016-04-22 DIAGNOSIS — Z418 Encounter for other procedures for purposes other than remedying health state: Secondary | ICD-10-CM

## 2016-04-22 DIAGNOSIS — N3289 Other specified disorders of bladder: Secondary | ICD-10-CM | POA: Diagnosis not present

## 2016-04-22 DIAGNOSIS — Z87891 Personal history of nicotine dependence: Secondary | ICD-10-CM

## 2016-04-22 DIAGNOSIS — Z6836 Body mass index (BMI) 36.0-36.9, adult: Secondary | ICD-10-CM | POA: Diagnosis not present

## 2016-04-22 HISTORY — PX: NEPHROLITHOTOMY: SHX5134

## 2016-04-22 HISTORY — PX: CYSTOSCOPY W/ URETERAL STENT PLACEMENT: SHX1429

## 2016-04-22 LAB — TYPE AND SCREEN
ABO/RH(D): O POS
Antibody Screen: NEGATIVE

## 2016-04-22 LAB — HEMOGLOBIN AND HEMATOCRIT, BLOOD
HCT: 40.2 % (ref 36.0–46.0)
Hemoglobin: 13.9 g/dL (ref 12.0–15.0)

## 2016-04-22 LAB — ABO/RH: ABO/RH(D): O POS

## 2016-04-22 SURGERY — NEPHROLITHOTOMY PERCUTANEOUS
Anesthesia: General | Site: Ureter | Laterality: Right

## 2016-04-22 MED ORDER — PROPOFOL 10 MG/ML IV BOLUS
INTRAVENOUS | Status: AC
  Filled 2016-04-22: qty 40

## 2016-04-22 MED ORDER — PROMETHAZINE HCL 25 MG/ML IJ SOLN: 6.2500 mg | INTRAMUSCULAR | Status: DC | PRN

## 2016-04-22 MED ORDER — ARTIFICIAL TEARS OP OINT
TOPICAL_OINTMENT | OPHTHALMIC | Status: AC
  Filled 2016-04-22: qty 3.5

## 2016-04-22 MED ORDER — LACTATED RINGERS IV SOLN
INTRAVENOUS | Status: DC | PRN
  Administered 2016-04-22 (×2): via INTRAVENOUS

## 2016-04-22 MED ORDER — LIDOCAINE HCL (CARDIAC) 20 MG/ML IV SOLN
INTRAVENOUS | Status: DC | PRN
  Administered 2016-04-22: 100 mg via INTRAVENOUS

## 2016-04-22 MED ORDER — MIDAZOLAM HCL 5 MG/5ML IJ SOLN
INTRAMUSCULAR | Status: DC | PRN
  Administered 2016-04-22: 2 mg via INTRAVENOUS

## 2016-04-22 MED ORDER — MIDAZOLAM HCL 2 MG/2ML IJ SOLN: 0.5000 mg | Freq: Once | INTRAMUSCULAR | Status: DC | PRN

## 2016-04-22 MED ORDER — SODIUM CHLORIDE 0.9 % IR SOLN
Status: DC | PRN
  Administered 2016-04-22: 13000 mL

## 2016-04-22 MED ORDER — HYDROMORPHONE HCL 1 MG/ML IJ SOLN
INTRAMUSCULAR | Status: DC | PRN
  Administered 2016-04-22 (×3): .4 mg via INTRAVENOUS

## 2016-04-22 MED ORDER — HYDROMORPHONE HCL 2 MG/ML IJ SOLN
INTRAMUSCULAR | Status: AC
Start: 2016-04-22 — End: 2016-04-22
  Filled 2016-04-22: qty 1

## 2016-04-22 MED ORDER — SODIUM CHLORIDE 0.9 % IJ SOLN
INTRAMUSCULAR | Status: AC
  Filled 2016-04-22: qty 10

## 2016-04-22 MED ORDER — HYDROMORPHONE HCL 1 MG/ML IJ SOLN
INTRAMUSCULAR | Status: AC
  Filled 2016-04-22: qty 1

## 2016-04-22 MED ORDER — ROCURONIUM BROMIDE 100 MG/10ML IV SOLN
INTRAVENOUS | Status: DC | PRN
  Administered 2016-04-22: 20 mg via INTRAVENOUS
  Administered 2016-04-22: 50 mg via INTRAVENOUS
  Administered 2016-04-22: 20 mg via INTRAVENOUS

## 2016-04-22 MED ORDER — ONDANSETRON HCL 4 MG/2ML IJ SOLN
4.0000 mg | INTRAMUSCULAR | Status: DC | PRN
  Administered 2016-04-22 – 2016-04-24 (×3): 4 mg via INTRAVENOUS
  Filled 2016-04-22 (×3): qty 2

## 2016-04-22 MED ORDER — HYDROMORPHONE HCL 1 MG/ML IJ SOLN
0.2500 mg | INTRAMUSCULAR | Status: DC | PRN
  Administered 2016-04-22 (×2): 0.25 mg via INTRAVENOUS

## 2016-04-22 MED ORDER — PROMETHAZINE HCL 25 MG/ML IJ SOLN
25.0000 mg | Freq: Four times a day (QID) | INTRAMUSCULAR | Status: DC | PRN
  Administered 2016-04-22 – 2016-04-25 (×5): 25 mg via INTRAVENOUS
  Filled 2016-04-22 (×7): qty 1

## 2016-04-22 MED ORDER — SUGAMMADEX SODIUM 200 MG/2ML IV SOLN
INTRAVENOUS | Status: AC
  Filled 2016-04-22: qty 2

## 2016-04-22 MED ORDER — ONDANSETRON HCL 4 MG/2ML IJ SOLN
INTRAMUSCULAR | Status: AC
  Filled 2016-04-22: qty 2

## 2016-04-22 MED ORDER — PROPOFOL 10 MG/ML IV BOLUS
INTRAVENOUS | Status: DC | PRN
  Administered 2016-04-22: 200 mg via INTRAVENOUS
  Administered 2016-04-22: 100 mg via INTRAVENOUS

## 2016-04-22 MED ORDER — OXYBUTYNIN CHLORIDE ER 10 MG PO TB24
10.0000 mg | ORAL_TABLET | Freq: Every day | ORAL | Status: DC
  Administered 2016-04-22 – 2016-04-24 (×3): 10 mg via ORAL
  Filled 2016-04-22 (×3): qty 1

## 2016-04-22 MED ORDER — KCL IN DEXTROSE-NACL 20-5-0.45 MEQ/L-%-% IV SOLN
INTRAVENOUS | Status: DC
Start: 2016-04-22 — End: 2016-04-25
  Administered 2016-04-22 – 2016-04-24 (×4): via INTRAVENOUS
  Filled 2016-04-22 (×6): qty 1000

## 2016-04-22 MED ORDER — DEXAMETHASONE SODIUM PHOSPHATE 10 MG/ML IJ SOLN
INTRAMUSCULAR | Status: AC
  Filled 2016-04-22: qty 1

## 2016-04-22 MED ORDER — ENSURE ENLIVE PO LIQD
237.0000 mL | Freq: Two times a day (BID) | ORAL | Status: DC
  Administered 2016-04-23: 237 mL via ORAL

## 2016-04-22 MED ORDER — 0.9 % SODIUM CHLORIDE (POUR BTL) OPTIME
TOPICAL | Status: DC | PRN
  Administered 2016-04-22: 1000 mL

## 2016-04-22 MED ORDER — HYDROMORPHONE HCL 1 MG/ML IJ SOLN
0.5000 mg | INTRAMUSCULAR | Status: DC | PRN
  Administered 2016-04-22 – 2016-04-23 (×4): 1 mg via INTRAVENOUS
  Filled 2016-04-22 (×4): qty 1

## 2016-04-22 MED ORDER — MIDAZOLAM HCL 2 MG/2ML IJ SOLN
INTRAMUSCULAR | Status: AC
  Filled 2016-04-22: qty 2

## 2016-04-22 MED ORDER — LIDOCAINE HCL (CARDIAC) 20 MG/ML IV SOLN
INTRAVENOUS | Status: AC
  Filled 2016-04-22: qty 5

## 2016-04-22 MED ORDER — BELLADONNA ALKALOIDS-OPIUM 16.2-60 MG RE SUPP
1.0000 | Freq: Four times a day (QID) | RECTAL | Status: DC | PRN
  Administered 2016-04-22 – 2016-04-23 (×2): 1 via RECTAL
  Filled 2016-04-22 (×2): qty 1

## 2016-04-22 MED ORDER — SUGAMMADEX SODIUM 200 MG/2ML IV SOLN
INTRAVENOUS | Status: DC | PRN
  Administered 2016-04-22: 200 mg via INTRAVENOUS

## 2016-04-22 MED ORDER — DEXAMETHASONE SODIUM PHOSPHATE 4 MG/ML IJ SOLN
INTRAMUSCULAR | Status: DC | PRN
  Administered 2016-04-22: 10 mg via INTRAVENOUS

## 2016-04-22 MED ORDER — OXYCODONE-ACETAMINOPHEN 5-325 MG PO TABS
1.0000 | ORAL_TABLET | ORAL | Status: DC | PRN
  Administered 2016-04-22: 1 via ORAL
  Administered 2016-04-23 – 2016-04-25 (×7): 2 via ORAL
  Filled 2016-04-22 (×7): qty 2
  Filled 2016-04-22: qty 1

## 2016-04-22 MED ORDER — FENTANYL CITRATE (PF) 250 MCG/5ML IJ SOLN
INTRAMUSCULAR | Status: AC
  Filled 2016-04-22: qty 5

## 2016-04-22 MED ORDER — FENTANYL CITRATE (PF) 100 MCG/2ML IJ SOLN
INTRAMUSCULAR | Status: DC | PRN
  Administered 2016-04-22 (×5): 50 ug via INTRAVENOUS

## 2016-04-22 MED ORDER — ONDANSETRON HCL 4 MG/2ML IJ SOLN
INTRAMUSCULAR | Status: DC | PRN
  Administered 2016-04-22: 4 mg via INTRAVENOUS

## 2016-04-22 MED ORDER — METOCLOPRAMIDE HCL 5 MG/ML IJ SOLN
INTRAMUSCULAR | Status: AC
  Filled 2016-04-22: qty 2

## 2016-04-22 MED ORDER — ACETAMINOPHEN 500 MG PO TABS
1000.0000 mg | ORAL_TABLET | Freq: Three times a day (TID) | ORAL | Status: AC
  Administered 2016-04-22 – 2016-04-23 (×3): 1000 mg via ORAL
  Filled 2016-04-22 (×3): qty 2

## 2016-04-22 MED ORDER — SENNOSIDES-DOCUSATE SODIUM 8.6-50 MG PO TABS
1.0000 | ORAL_TABLET | Freq: Two times a day (BID) | ORAL | Status: DC
  Administered 2016-04-23 – 2016-04-25 (×4): 1 via ORAL
  Filled 2016-04-22 (×4): qty 1

## 2016-04-22 MED ORDER — MAGNESIUM CITRATE PO SOLN: 1.0000 | Freq: Once | ORAL | Status: DC

## 2016-04-22 MED ORDER — ROCURONIUM BROMIDE 100 MG/10ML IV SOLN
INTRAVENOUS | Status: AC
  Filled 2016-04-22: qty 1

## 2016-04-22 MED ORDER — MEPERIDINE HCL 50 MG/ML IJ SOLN: 6.2500 mg | INTRAMUSCULAR | Status: DC | PRN

## 2016-04-22 MED ORDER — IOHEXOL 300 MG/ML  SOLN
INTRAMUSCULAR | Status: DC | PRN
  Administered 2016-04-22: 100 mL

## 2016-04-22 SURGICAL SUPPLY — 60 items
BAG URINE DRAINAGE (UROLOGICAL SUPPLIES) ×3 IMPLANT
BAG URO CATCHER STRL LF (MISCELLANEOUS) ×3 IMPLANT
BASKET LASER NITINOL 1.9FR (BASKET) IMPLANT
BASKET ZERO TIP NITINOL 2.4FR (BASKET) IMPLANT
BENZOIN TINCTURE PRP APPL 2/3 (GAUZE/BANDAGES/DRESSINGS) ×3 IMPLANT
BLADE SURG 15 STRL LF DISP TIS (BLADE) ×2 IMPLANT
BLADE SURG 15 STRL SS (BLADE) ×1
CATH FOLEY 2W COUNCIL 20FR 5CC (CATHETERS) IMPLANT
CATH FOLEY 2WAY SLVR  5CC 16FR (CATHETERS) ×1
CATH FOLEY 2WAY SLVR 5CC 16FR (CATHETERS) ×2 IMPLANT
CATH IMAGER II 65CM (CATHETERS) ×3 IMPLANT
CATH INTERMIT  6FR 70CM (CATHETERS) ×3 IMPLANT
CATH ROBINSON RED A/P 20FR (CATHETERS) IMPLANT
CATH ULTRATHANE 14FR (STENTS) ×3 IMPLANT
CATH X-FORCE N30 NEPHROSTOMY (TUBING) ×3 IMPLANT
CHLORAPREP W/TINT 26ML (MISCELLANEOUS) ×6 IMPLANT
CLOTH BEACON ORANGE TIMEOUT ST (SAFETY) ×3 IMPLANT
COVER SURGICAL LIGHT HANDLE (MISCELLANEOUS) ×3 IMPLANT
DRAPE C-ARM 42X120 X-RAY (DRAPES) ×3 IMPLANT
DRAPE LINGEMAN PERC (DRAPES) ×3 IMPLANT
DRAPE SHEET LG 3/4 BI-LAMINATE (DRAPES) ×3 IMPLANT
DRAPE SURG IRRIG POUCH 19X23 (DRAPES) ×3 IMPLANT
DRSG PAD ABDOMINAL 8X10 ST (GAUZE/BANDAGES/DRESSINGS) ×6 IMPLANT
DRSG TEGADERM 8X12 (GAUZE/BANDAGES/DRESSINGS) ×3 IMPLANT
FIBER LASER FLEXIVA 1000 (UROLOGICAL SUPPLIES) IMPLANT
FIBER LASER FLEXIVA 365 (UROLOGICAL SUPPLIES) IMPLANT
FIBER LASER FLEXIVA 550 (UROLOGICAL SUPPLIES) IMPLANT
FIBER LASER TRAC TIP (UROLOGICAL SUPPLIES) IMPLANT
GAUZE SPONGE 4X4 12PLY STRL (GAUZE/BANDAGES/DRESSINGS) ×3 IMPLANT
GLOVE BIOGEL M STRL SZ7.5 (GLOVE) ×9 IMPLANT
GOWN STRL REUS W/TWL LRG LVL3 (GOWN DISPOSABLE) ×6 IMPLANT
GUIDEWIRE AMPLAZ .035X145 (WIRE) ×6 IMPLANT
GUIDEWIRE ANG ZIPWIRE 038X150 (WIRE) ×6 IMPLANT
GUIDEWIRE STR DUAL SENSOR (WIRE) ×3 IMPLANT
IV SET EXTENSION CATH 6 NF (IV SETS) ×3 IMPLANT
KIT BASIN OR (CUSTOM PROCEDURE TRAY) ×3 IMPLANT
MANIFOLD NEPTUNE II (INSTRUMENTS) ×3 IMPLANT
NEEDLE TROCAR 18X15 ECHO (NEEDLE) ×3 IMPLANT
NEEDLE TROCAR 18X20 (NEEDLE) IMPLANT
NS IRRIG 1000ML POUR BTL (IV SOLUTION) ×3 IMPLANT
PACK CYSTO (CUSTOM PROCEDURE TRAY) ×3 IMPLANT
PAD ABD 8X10 STRL (GAUZE/BANDAGES/DRESSINGS) ×3 IMPLANT
PROBE LITHOCLAST ULTRA 3.8X403 (UROLOGICAL SUPPLIES) ×3 IMPLANT
PROBE PNEUMATIC 1.0MMX570MM (UROLOGICAL SUPPLIES) ×3 IMPLANT
SET IRRIG Y TYPE TUR BLADDER L (SET/KITS/TRAYS/PACK) ×3 IMPLANT
SHEATH PEELAWAY SET 9 (SHEATH) ×3 IMPLANT
SPONGE LAP 4X18 X RAY DECT (DISPOSABLE) ×3 IMPLANT
STENT URET 6FRX24 CONTOUR (STENTS) ×3 IMPLANT
STONE CATCHER W/TUBE ADAPTER (UROLOGICAL SUPPLIES) ×3 IMPLANT
SUT SILK 2 0 30  PSL (SUTURE) ×1
SUT SILK 2 0 30 PSL (SUTURE) ×2 IMPLANT
SYR 20CC LL (SYRINGE) ×6 IMPLANT
SYR 50ML LL SCALE MARK (SYRINGE) ×3 IMPLANT
SYRINGE 10CC LL (SYRINGE) ×3 IMPLANT
TOWEL OR 17X26 10 PK STRL BLUE (TOWEL DISPOSABLE) ×3 IMPLANT
TUBE CONNECTING VINYL 14FR 30C (MISCELLANEOUS) ×6 IMPLANT
TUBE FEEDING 8FR 16IN STR KANG (MISCELLANEOUS) ×3 IMPLANT
TUBING CONNECTING 10 (TUBING) ×9 IMPLANT
WATER STERILE IRR 1500ML POUR (IV SOLUTION) IMPLANT
WATER STERILE IRR 3000ML UROMA (IV SOLUTION) ×3 IMPLANT

## 2016-04-22 NOTE — Transfer of Care (Signed)
Last Vitals:  Filed Vitals:   04/22/16 1258 04/22/16 1300  BP: 155/96 142/88  Pulse: 83 93  Temp:  37.1 C  Resp: 13 11    Last Pain: There were no vitals filed for this visit.       Immediate Anesthesia Transfer of Care Note  Patient: Lelon HuhConnor B Alleva  Procedure(s) Performed: Procedure(s) (LRB): 1ST STAGE NEPHROLITHOTOMY PERCUTANEOUS WITH SURGEON ACCESS (Right) CYSTOSCOPY WITH RETROGRADE PYELOGRAM/ bilateral URETERAL STENT PLACEMENT bilateral (Bilateral)  Patient Location: PACU  Anesthesia Type: General  Level of Consciousness: awake, alert  and oriented  Airway & Oxygen Therapy: Patient Spontanous Breathing and Patient connected to face mask oxygen  Post-op Assessment: Report given to PACU RN and Post -op Vital signs reviewed and stable  Post vital signs: Reviewed and stable  Complications: No apparent anesthesia complications

## 2016-04-22 NOTE — Op Note (Signed)
NAMEMarland Kitchen  Jennifer Hebert, Jennifer Hebert NO.:  [id number]  MEDICAL RECORD NO.:  [id number]  LOCATION:  1434                         FACILITY:  [facility]  PHYSICIAN:  Sebastian Ache, MD     DATE OF BIRTH:  1991-04-01  DATE OF PROCEDURE:                              OPERATIVE REPORT  DIAGNOSIS:  Very large right renal stone, small left renal stone.  PROCEDURES: 1. Cystoscopy with bilateral retrograde pyelogram and interpretation. 2. Insertion of bilateral ureteral stents. 3. Right first-stage percutaneous nephrostolithotomy stone greater     than 2 cm. 4. Needle access to right kidney. 5. Balloon dilation of right renal tract. 6. Right antegrade nephrostogram with interpretation.  ESTIMATED BLOOD LOSS:  100 mL.  COMPLICATION:  None.  SPECIMENS:  Right renal stone fragments for compositional analysis.  FINDINGS: 1. Unremarkable urinary bladder. 2. Unremarkable left retrograde pyelogram. 3. Successful placement of left ureteral stent, 6 x 24, Contour, no     tether. 4. Mild right hydroureteronephrosis. 5. Large filling defect in the right kidney consistent with very large     right stone. 6. Successful placement of right ureteral stent. 7. Very large right intrarenal stone as anticipated, estimated     approximately 3 cm volume. 8. Removal of approximately 80-90% of right renal stone today. 9. Successful placement of right nephrostomy tube proximal end, upper     pole, and nephroureteral stent distal end in the urinary bladder,     proximal end externalized and capped.  DRAINS: 1. Foley catheter to straight drain. 2. Right nephrostomy tube to gravity drainage.  INDICATION:  Jennifer Hebert is a very pleasant 25 year old young woman who was found on workup of hematuria and flank pain to have a very large right renal stone at 2.5 to 3 cm in size.  She also has a small left renal stone.  Given the large volume stones, although some right hydronephrosis and her young fertile  status, it was clearly felt that management of this was warranted.  Options were discussed including retrograde approach versus antegrade approach versus combined approaches with varying goals of treatment and she wished to proceed with staged right percutaneous nephrostolithotomy and left ureteroscopy with goal of bilateral stone free.  Informed consent was obtained and placed in the medical record.  PROCEDURE IN DETAIL:  The patient being Jennifer Hebert, was verified. Procedure being cysto, bilateral retrogrades, left stent placement, right first stage percutaneous nephrostolithotomy was confirmed. Procedure was carried out.  Time-out was performed.  Intravenous antibiotics were administered.  General endotracheal anesthesia was introduced.  The patient was initially placed into a low lithotomy position and sterile field was created by prepping and draping the patient's vagina, introitus and proximal thighs using iodine x3.  Next, cystourethroscopy was performed using a 23-French rigid cystoscope with offset lens.  Inspection of the urinary bladder revealed no diverticula, calcifications, papillary lesions.  Ureteral orifices were singleton bilaterally.  The left ureteral orifice was cannulated with a 6-French end-hole catheter and left retrograde pyelogram was obtained.  Left retrograde pyelogram demonstrated a single left ureter with single- system left kidney.  No obvious filling defects or narrowing noted.  A 0.038 Zip wire was advanced to the level of the upper  pole, over which, a new 6 x 24 Contour-type stent was carefully deployed, proximal end in the distal most renal pelvis, distal end in the urinary bladder.  Next, right retrograde pyelogram was obtained.  Right retrograde pyelogram demonstrated a single right ureter with single-system right kidney.  There was mild-to-moderate hydronephrosis with minimal ureteronephrosis and large filling defect in the renal pelvis  consistent with known stone.  The open-ended catheter was very carefully navigated under continuous fluoroscopic vision to the level of the midpole of the kidney.  Foley catheter was placed per urethra to straight drain and the externalized catheter was fashioned to this with a piece of extension tubing and stopcock.  The patient was then repositioned into a prone position implying approximately 10 degrees of table flexion to maximize space between the twelfth rib and iliac crest. Prone view was utilized after axillary rolls, padding of her bony prominences including her knees and elbows.  The right flank was prepped using chlorhexidine gluconate prepping the externalized nephroureteral stent in situ with this.  Using spot fluoroscopy, a suitable site was chosen for percutaneous access in the lower pole.  Ideally, a midpole calyx would have been used; however, this would have required an intercostal access.  It was felt that the safest means would be lower pole with subcostal axis.  As such, the 18-gauge Chiba needle was used by 15 degrees off superior axis using a bull's eye technique with simultaneous retrograde filling of the kidney and lower pole calyx was entered from its lateral aspect.  This was verified by efflux of copious clear-appearing urine.  A 0.038 Zip wire was then advanced via this tract, coiled in the level of the renal pelvis and then navigated down the right ureter to the level of the urinary bladder with the guidance of an Imager-type catheter.  This was exchanged for a Superstiff wire. The skin incision was then created approximately 1.5 cm diameter. Fascia was dilated, a fluoroscopically-guided hemostat and dual-axial introducer was advanced to the level of the proximal ureter.  Through a, separate Zip wire was advanced and exchanged for a second superstiff wire via the Imager catheter.  The dual superstiff axis percutaneous drape was applied.  Once superstiff wire  was set aside as a safety wire over the next superstiff wire, a 24-French NephroMax balloon dilation apparatus was very carefully positioned across the lower pole calyx into the lower pole infundibulum, inflated to pressure of 20 atmospheres held for 90 seconds and the sheath was very carefully advanced into this lower pole infundibulum using fluoroscopic guidance.  Rigid nephroscopy was then performed.  This revealed excellent sheath placement and apposition to very large renal pelvis stone, it did appear amenable to rigid lithoclast energy.  As such, the LithoClast dual ultrasound pneumatic device was used for approximately 55 minutes allowing fragmentation that was felt to be 80-90% of the total stone volume, that was accessible via rigid technique.  Flexible nephroscopy was then performed and there was minimal residual stone volume noted.  The area of the UPJ was inspected.  There were no obvious disruptions.  A Zip wire was advanced to the upper pole calyx and coiled in place and a new 14-French nephrostomy tube was then placed into this and coiled in the upper pole calyx.  Once of the superstiff safety wires was exchanged for the Imager-type catheter acting as an antegrade nephroureteral stent to the level of the urinary bladder with the externalized portion capped. The final safety wire was removed.  Spot  final fluoroscopic antegrade images revealed excellent placement of the nephrostomy tube.  No medial extravasation whatsoever.  We achieved the goals of procedure today. The tubes were anchored in place with interrupted silk.  Percutaneous drape was applied and procedure was terminated.  The patient tolerated the procedure well.  There were no immediate periprocedural complications. The patient was taken to the postanesthesia care unit in stable condition.          ______________________________ Sebastian Acheheodore Jennifer Wilcher, MD     TM/MEDQ  D:  04/22/2016  T:  04/22/2016  Job:   [id number]

## 2016-04-22 NOTE — Brief Op Note (Signed)
04/22/2016  12:36 PM  PATIENT:  Jennifer Hebert  25 y.o. female  PRE-OPERATIVE DIAGNOSIS:  RIGHT STAGHORN CALCULUS, LEFT RENAL CALCULUS  POST-OPERATIVE DIAGNOSIS:  right staghorn calculus left renal calculus  PROCEDURE:  Procedure(s): 1ST STAGE NEPHROLITHOTOMY PERCUTANEOUS WITH SURGEON ACCESS (Right) CYSTOSCOPY WITH RETROGRADE PYELOGRAM/ bilateral URETERAL STENT PLACEMENT bilateral (Bilateral)  SURGEON:  Surgeon(s) and Role:    * Sebastian Acheheodore Amerah Puleo, MD - Primary  PHYSICIAN ASSISTANT:   ASSISTANTS: none   ANESTHESIA:   general  EBL:  Total I/O In: 1500 [I.V.:1500] Out: 100 [Blood:100]  BLOOD ADMINISTERED:none  DRAINS: 1 - foley to gravity; 2 - Rt nephrostomy to gravity; 3 - Rt nephroureteral stent (capped)   LOCAL MEDICATIONS USED:  NONE  SPECIMEN:  Source of Specimen:  Rt renal stone fragments  DISPOSITION OF SPECIMEN:  [facility] for compositional analysis  COUNTS:  YES  TOURNIQUET:  * No tourniquets in log *  DICTATION: .Other Dictation: Dictation Number [id number]  PLAN OF CARE: Admit to inpatient   PATIENT DISPOSITION:  PACU - hemodynamically stable.   Delay start of Pharmacological VTE agent (>24hrs) due to surgical blood loss or risk of bleeding: yes

## 2016-04-22 NOTE — H&P (Signed)
Jennifer HuhConnor B Life is an 25 y.o. female.    Chief Complaint: Pre-op RIGHT first stage percutaneous nephrsotolithotomy  HPI:  1 - High Risk Nephrolithiasis - Rt 2.4cm partial staghorn stone with some mild intra-renal hydro (1280HU, mostly mid pole) and left 4mm intra-renal non-obstructing stones by ER CT on eval recurrnet Rt abdominal - flank pain.  PMH sig for TNA, ankle fracure. NO CV disease / blood thinners.   Today "Jennifer Hebert" is seen to proceed with cysto, bilateral retrogrades / stents and first stage RIGHT percutaneous nephrostolithotomy. No interval fevers. Most recent UCX non-clonal.    Past Medical History  Diagnosis Date  . Family history of adverse reaction to anesthesia     mother slow to wake up after hysterectomy   . Anxiety     was on meds no longer - last use of anxiey meds in 06/2015   . GERD (gastroesophageal reflux disease)     Past Surgical History  Procedure Laterality Date  . Ankle surgery Right   . Tonsillectomy    . Precancerous skin cancers removed       No family history on file. Social History:  reports that she quit smoking about 9 months ago. She has never used smokeless tobacco. She reports that she drinks alcohol. She reports that she uses illicit drugs (Marijuana).  Allergies: No Known Allergies  No prescriptions prior to admission    Results for orders placed or performed during the hospital encounter of 04/20/16 (from the past 48 hour(s))  CBC     Status: None   Collection Time: 04/20/16 10:15 AM  Result Value Ref Range   WBC 7.3 4.0 - 10.5 K/uL   RBC 4.82 3.87 - 5.11 MIL/uL   Hemoglobin 13.7 12.0 - 15.0 g/dL   HCT 19.140.2 47.836.0 - 29.546.0 %   MCV 83.4 78.0 - 100.0 fL   MCH 28.4 26.0 - 34.0 pg   MCHC 34.1 30.0 - 36.0 g/dL   RDW 62.112.5 30.811.5 - 65.715.5 %   Platelets 310 150 - 400 K/uL  hCG, serum, qualitative     Status: None   Collection Time: 04/20/16 10:15 AM  Result Value Ref Range   Preg, Serum NEGATIVE NEGATIVE    Comment:        THE  SENSITIVITY OF THIS METHODOLOGY IS >10 mIU/mL.    No results found.  Review of Systems  Constitutional: Negative.  Negative for fever and chills.  HENT: Negative.   Eyes: Negative.   Respiratory: Negative.   Cardiovascular: Negative.   Gastrointestinal: Negative.   Genitourinary: Negative.   Musculoskeletal: Negative.   Skin: Negative.   Neurological: Negative.   Endo/Heme/Allergies: Negative.   Psychiatric/Behavioral: Negative.     There were no vitals taken for this visit. Physical Exam  Constitutional: She is oriented to person, place, and time. She appears well-developed.  HENT:  Head: Normocephalic.  Eyes: Pupils are equal, round, and reactive to light.  Neck: Normal range of motion.  Cardiovascular: Normal rate.   Respiratory: Effort normal.  GI: Soft.  Genitourinary:  Mild Rt CVAT  Musculoskeletal: Normal range of motion.  Neurological: She is alert and oriented to person, place, and time.  Skin: Skin is warm.  Psychiatric: She has a normal mood and affect.     Assessment/Plan  1 - High Risk Nephrolithiasis - very concerning to have such stones at young age in fertile female. Discussed short and long term implicaitons including rec surgeyr to achieve stone free, followed by metabolic eval, and  at least yearly surveillance. Also STRONGLY advised to avoid elective pregnancies / coming off birht control unless verified stone free prior.  After careful consideration, the patient has elected to undergo  A - Rt PCNL, Bilat stent B - Rt 2nd stage PCNL / Lt URS 2 days later single hospitalization  We rediscussed percutaneous nephrostolithotomy (PCNL) in detail including need for percutaneous access which is sometimes gained by the surgeon, and other times by radiology or through existing nephrostomy tubes if present. We specifically rediscussed that often times tubes remain in after surgery until we are confident all stone has been treated. We rementioned that staged  surgery is needed in over 50% of cases of very large or complex stone. We then rediscussed general risks including bleeding, infection, damage to kidney / ureter / bladder, loss of kidney, as well as anesthetic risks and rare but serious surgical complications including DVT, PE, MI, and mortality.  She voiced understanding and desire to proceed today as planned.    Sebastian AcheMANNY, Aayushi Solorzano, MD 04/22/2016, 7:40 AM

## 2016-04-22 NOTE — Anesthesia Procedure Notes (Signed)
Procedure Name: Intubation Date/Time: 04/22/2016 10:17 AM Performed by: Norva PavlovALLAWAY, Dhillon [place]un[place] G Pre-anesthesia Checklist: Patient identified, Emergency Drugs available, Suction available and Patient being monitored Patient Re-evaluated:Patient Re-evaluated prior to inductionOxygen Delivery Method: Circle system utilized Preoxygenation: Pre-oxygenation with 100% oxygen Intubation Type: IV induction Ventilation: Mask ventilation without difficulty Laryngoscope Size: Mac and 4 Grade View: Grade I Tube type: Oral Tube size: 7.5 mm Number of attempts: 1 Airway Equipment and Method: Stylet Placement Confirmation: ETT inserted through vocal cords under direct vision,  positive ETCO2 and breath sounds checked- equal and bilateral Secured at: 21 cm Tube secured with: Tape Dental Injury: Teeth and Oropharynx as per pre-operative assessment

## 2016-04-22 NOTE — Progress Notes (Signed)
Urol Post-op:  S: C/o bladder spasms and nausea with emesis.  O: Very anxious, nauseated with emesis. Rt neph tube with medium pink urine Folye with light pink urine No flank hemtomas, no clots in tubing No r/g SCD's inplace  Afebrile  A/P: - IV phenergan for nausea and anxiety - oxybutynin XL for bladder spasms, also B+O avail if cannot tollerate PO

## 2016-04-22 NOTE — Progress Notes (Addendum)
Pharmacy: Gentamicin  Assessment:  Gentamicin ordered for surgical prophylaxis. Pt received Gentamicin 360mg   IV x 1 (5mg /kg) pre-op @1005  today s/p cystoscopy with bilateral ureteral stent placement.  Plan: As discussed with MD, Gentamicin pre-op dose should be adequate coverage for 24 hours surgical prophylaxis. No further Gentamicin dose will be given. Will sign off. Please reconsult if more Gentamicin is needed.  Thanks, Dorethea ClanMary Ann Arely Tinner, PharmD 04/22/2016

## 2016-04-22 NOTE — Anesthesia Preprocedure Evaluation (Addendum)
Anesthesia Evaluation  Patient identified by MRN, date of birth, ID band Patient awake    Reviewed: Allergy & Precautions, NPO status , Patient's Chart, lab work & pertinent test results  History of Anesthesia Complications Negative for: history of anesthetic complications  Airway Mallampati: III  TM Distance: >3 FB Neck ROM: Full    Dental  (+) Dental Advisory Given   Pulmonary former smoker (quit 2016),    breath sounds clear to auscultation       Cardiovascular negative cardio ROS   Rhythm:Regular Rate:Normal     Neuro/Psych negative neurological ROS     GI/Hepatic GERD  Controlled,(+)     substance abuse  marijuana use,   Endo/Other  Morbid obesity  Renal/GU stones     Musculoskeletal   Abdominal (+) + obese,   Peds  Hematology negative hematology ROS (+)   Anesthesia Other Findings   Reproductive/Obstetrics 04/20/16 preg test NEG                         Anesthesia Physical Anesthesia Plan  ASA: II  Anesthesia Plan: General   Post-op Pain Management:    Induction: Intravenous  Airway Management Planned: Oral ETT  Additional Equipment:   Intra-op Plan:   Post-operative Plan: Extubation in OR  Informed Consent: I have reviewed the patients History and Physical, chart, labs and discussed the procedure including the risks, benefits and alternatives for the proposed anesthesia with the patient or authorized representative who has indicated his/her understanding and acceptance.   Dental advisory given  Plan Discussed with: CRNA and Surgeon  Anesthesia Plan Comments: (Plan routine monitors, GA)      Anesthesia Quick Evaluation

## 2016-04-23 ENCOUNTER — Encounter (HOSPITAL_COMMUNITY): Payer: Self-pay | Admitting: Urology

## 2016-04-23 LAB — HEMOGLOBIN AND HEMATOCRIT, BLOOD
HEMATOCRIT: 37.9 % (ref 36.0–46.0)
HEMOGLOBIN: 12.7 g/dL (ref 12.0–15.0)

## 2016-04-23 LAB — BASIC METABOLIC PANEL
ANION GAP: 6 (ref 5–15)
BUN: 7 mg/dL (ref 6–20)
CALCIUM: 8.6 mg/dL — AB (ref 8.9–10.3)
CO2: 25 mmol/L (ref 22–32)
Chloride: 110 mmol/L (ref 101–111)
Creatinine, Ser: 0.77 mg/dL (ref 0.44–1.00)
GLUCOSE: 121 mg/dL — AB (ref 65–99)
Potassium: 3.8 mmol/L (ref 3.5–5.1)
SODIUM: 141 mmol/L (ref 135–145)

## 2016-04-23 MED ORDER — DEXTROSE 5 % IV SOLN
360.0000 mg | INTRAVENOUS | Status: DC
  Filled 2016-04-23: qty 9

## 2016-04-23 NOTE — Progress Notes (Signed)
Initial Nutrition Assessment  DOCUMENTATION CODES:   Obesity unspecified  INTERVENTION: -Encourage PO intake -Continue Ensure Enlive po BID, each supplement provides 350 kcal and 20 grams of protein -Monitor for Diet Education needs depending on what formed stone.  NUTRITION DIAGNOSIS:   Inadequate oral intake related to poor appetite, other (see comment), acute illness (pain) as evidenced by per patient/family report.  GOAL:   Patient will meet greater than or equal to 90% of their needs  MONITOR:   PO intake, Supplement acceptance, Labs, I & O's, Skin  REASON FOR ASSESSMENT:   Malnutrition Screening Tool    ASSESSMENT:   High Risk Nephrolithiasis - Rt 2.4cm partial staghorn stone with some mild intra-renal hydro (1280HU, mostly mid pole) and left 4mm intra-renal non-obstructing stones by ER CT on eval recurrnet Rt abdominal - flank pain.  Jennifer Hebert is a pleasant 25 yo lady who presented with a kidney stone "between the size of a golf ball and a lime." She underwent a percutaneous nephrostolithotomy yesterday. Has to undergo another procedure tomorrow to have a smaller stone removed. She states that PTA she ate mostly a bland diet. States when she ate spicier foods her pain flared up. According to MD, she was told the stone has been in her kidney for 2 years. Today, she had french toast and coffee for breakfast. She was up walking around in her room during my visit and seems to be feeling much better. Denies weight loss. No nausea/vomiting. We do not know what her stones are made of yet, but she may require diet education prior to discharge to prevent recurrence of stone formation.  Nutrition-Focused physical exam completed. Findings are no fat depletion, no muscle depletion, and no edema.   Labs and Medications reviewed: D5 @ 75 --> 306 calories, Senokot   Diet Order:  Diet regular Room service appropriate?: Yes; Fluid consistency:: Thin Diet NPO time specified Except  for: Sips with Meds  Skin:  Wound (see comment) (Incision to back)  Last BM:  6/28  Height:   Ht Readings from Last 1 Encounters:  04/22/16 5\' 5"  (1.651 m)    Weight:   Wt Readings from Last 1 Encounters:  04/22/16 218 lb (98.884 kg)    Ideal Body Weight:  56.81 kg  BMI:  Body mass index is 36.28 kg/(m^2).  Estimated Nutritional Needs:   Kcal:  1700-2000 calories  Protein:  55-70 grams  Fluid:  >/= 1.7L  EDUCATION NEEDS:   No education needs identified at this time  Jennifer AnoWilliam M. Thorvald Orsino, MS, RD LDN Inpatient Clinical Dietitian Pager [phone]

## 2016-04-23 NOTE — Progress Notes (Signed)
1 Day Post-Op  Subjective:  1 - Right > Left Nephrolithiasis - s/p right first stage percutaneous stone surgery and left ureteral stent placement 6/28. Vast majority of right sided stone removed.   Today Jennifer Hebert is stable. Anxious and initially nauseas, but this is improved. Voiding with foley removed this AM. Hbg approx stable at neat 13. No fevers.     Objective: Vital signs in last 24 hours: Temp:  [98.7 F (37.1 C)-99.7 F (37.6 C)] 98.7 F (37.1 C) (06/29 1500) Pulse Rate:  [60-66] 66 (06/29 1500) Resp:  [18-20] 18 (06/29 1500) BP: (123-164)/(79-97) 129/79 mmHg (06/29 1500) SpO2:  [97 %-100 %] 98 % (06/29 1500) Last BM Date: 04/22/16  Intake/Output from previous day: 06/28 0701 - 06/29 0700 In: 3040 [I.V.:3040] Out: 2170 [Urine:2070; Blood:100] Intake/Output this shift: Total I/O In: 240 [P.O.:240] Out: 400 [Urine:400]  General appearance: alert, cooperative and anxious Eyes: negative Nose: Nares normal. Septum midline. Mucosa normal. No drainage or sinus tenderness. Throat: lips, mucosa, and tongue normal; teeth and gums normal Neck: supple, symmetrical, trachea midline Back: symmetric, no curvature. ROM normal. No CVA tenderness., Rt nephrostomy time wtih medium yellow urine and scant debris as expected. No flank hematomas.  Resp: non-labored on room air Cardio: Nl rate GI: soft, non-tender; bowel sounds normal; no masses,  no organomegaly Pelvic: foley now removed Extremities: extremities normal, atraumatic, no cyanosis or edema Pulses: 2+ and symmetric Skin: Skin color, texture, turgor normal. No rashes or lesions Lymph nodes: Cervical, supraclavicular, and axillary nodes normal. Neurologic: Grossly normal Incision/Wound: as per above  Lab Results:   Recent Labs  04/22/16 1333 04/23/16 0458  HGB 13.9 12.7  HCT 40.2 37.9   BMET  Recent Labs  04/23/16 0458  NA 141  K 3.8  CL 110  CO2 25  GLUCOSE 121*  BUN 7  CREATININE 0.77  CALCIUM 8.6*    PT/INR No results for input(s): LABPROT, INR in the last 72 hours. ABG No results for input(s): PHART, HCO3 in the last 72 hours.  Invalid input(s): PCO2, PO2  Studies/Results: Dg Retrograde Pyelogram  04/22/2016  CLINICAL DATA:  25 year old female undergoing right percutaneous nephrolithotomy and ureteral stent placement. EXAM: RETROGRADE PYELOGRAM; DG C-ARM 1-60 MIN-NO REPORT COMPARISON:  CT abdomen/ pelvis 03/13/2016 FINDINGS: A total of 13 intraoperative saved images demonstrate catheterization of the left ureteral orifice with retrograde ureteropyelogram. The left renal collecting system is near normal. There is a focal filling defect in the lower pole consistent with the presence of a small stone. No hydronephrosis or urothelial lesion. A double-J ureteral stent was then placed. Subsequently, the right ureteral orifice is catheterized and retrograde ureteropyelogram is performed. There is moderate right-sided hydronephrosis secondary to a partially obstructing stone in the renal pelvis. Subsequent images demonstrate placement of a percutaneous nephrostomy tube into the upper pole infundibulum on the right and a 5 [place] safety catheter extending into the proximal ureter. IMPRESSION: 1. At least partially obstructing stone in the right renal pelvis. 2. Percutaneous nephrolithotomy with a percutaneous nephrostomy tube left in the upper pole infundibulum and a 5 [place] safety catheter in the ureter. 3. Placement of a left-sided double-J ureteral stent. Electronically Signed   By: Malachy MoanHeath  McCullough M.D.   On: 04/22/2016 14:30   Dg C-arm 1-60 Min-no Report  04/22/2016  CLINICAL DATA: kidney stones C-ARM 1-60 MINUTES Fluoroscopy was utilized by the requesting physician.  No radiographic interpretation.    Anti-infectives: Anti-infectives    Start     Dose/Rate Route Frequency  Ordered Stop   04/22/16 0000  gentamicin (GARAMYCIN) 360 mg in dextrose 5 % 100 mL IVPB     360 mg 218 mL/hr over 30  Minutes Intravenous 30 min pre-op 04/21/16 1349 04/22/16 1035      Assessment/Plan:  1 - Right > Left Nephrolithiasis - doing well s/p first stage stone surgery. NPO p MN for 2nd stage surgery tomorrow with goal of stone free.   [facility], Jennifer Hebert 04/23/2016

## 2016-04-24 ENCOUNTER — Inpatient Hospital Stay (HOSPITAL_COMMUNITY): Payer: BLUE CROSS/BLUE SHIELD | Admitting: Certified Registered Nurse Anesthetist

## 2016-04-24 ENCOUNTER — Encounter (HOSPITAL_COMMUNITY)
Admission: RE | Disposition: A | Discharge status: Home / Self Care | Payer: Self-pay | Source: Ambulatory Visit | Attending: Urology

## 2016-04-24 ENCOUNTER — Encounter (HOSPITAL_COMMUNITY): Payer: Self-pay | Admitting: Certified Registered Nurse Anesthetist

## 2016-04-24 ENCOUNTER — Inpatient Hospital Stay (HOSPITAL_COMMUNITY): Admission: RE | Admit: 2016-04-24 | Payer: BLUE CROSS/BLUE SHIELD | Source: Ambulatory Visit | Admitting: Urology

## 2016-04-24 ENCOUNTER — Inpatient Hospital Stay (HOSPITAL_COMMUNITY): Payer: BLUE CROSS/BLUE SHIELD

## 2016-04-24 HISTORY — PX: CYSTOSCOPY W/ URETERAL STENT PLACEMENT: SHX1429

## 2016-04-24 HISTORY — PX: CYSTOSCOPY WITH URETEROSCOPY: SHX5123

## 2016-04-24 HISTORY — PX: HOLMIUM LASER APPLICATION: SHX5852

## 2016-04-24 HISTORY — PX: NEPHROLITHOTOMY: SHX5134

## 2016-04-24 SURGERY — NEPHROLITHOTOMY PERCUTANEOUS
Anesthesia: General | Laterality: Right

## 2016-04-24 MED ORDER — DIPHENHYDRAMINE HCL 50 MG/ML IJ SOLN
INTRAMUSCULAR | Status: AC
  Filled 2016-04-24: qty 1

## 2016-04-24 MED ORDER — SCOPOLAMINE 1 MG/3DAYS TD PT72
MEDICATED_PATCH | TRANSDERMAL | Status: DC | PRN
  Administered 2016-04-24: 1 via TRANSDERMAL

## 2016-04-24 MED ORDER — METOCLOPRAMIDE HCL 5 MG/ML IJ SOLN
INTRAMUSCULAR | Status: DC | PRN
  Administered 2016-04-24: 10 mg via INTRAVENOUS

## 2016-04-24 MED ORDER — IOHEXOL 300 MG/ML  SOLN
INTRAMUSCULAR | Status: DC | PRN
  Administered 2016-04-24: 35 mL

## 2016-04-24 MED ORDER — ONDANSETRON HCL 4 MG/2ML IJ SOLN
INTRAMUSCULAR | Status: AC
  Filled 2016-04-24: qty 2

## 2016-04-24 MED ORDER — METOCLOPRAMIDE HCL 5 MG/ML IJ SOLN
INTRAMUSCULAR | Status: AC
  Filled 2016-04-24: qty 2

## 2016-04-24 MED ORDER — OXYCODONE HCL 5 MG PO TABS: 5.0000 mg | ORAL_TABLET | Freq: Once | ORAL | Status: DC | PRN

## 2016-04-24 MED ORDER — LIDOCAINE HCL (CARDIAC) 20 MG/ML IV SOLN
INTRAVENOUS | Status: DC | PRN
  Administered 2016-04-24: 50 mg via INTRAVENOUS

## 2016-04-24 MED ORDER — HYDROMORPHONE HCL 1 MG/ML IJ SOLN: 0.2500 mg | INTRAMUSCULAR | Status: DC | PRN

## 2016-04-24 MED ORDER — MIDAZOLAM HCL 5 MG/5ML IJ SOLN
INTRAMUSCULAR | Status: DC | PRN
  Administered 2016-04-24 (×2): 1 mg via INTRAVENOUS

## 2016-04-24 MED ORDER — PROPOFOL 10 MG/ML IV BOLUS
INTRAVENOUS | Status: AC
  Filled 2016-04-24: qty 20

## 2016-04-24 MED ORDER — CHLORHEXIDINE GLUCONATE CLOTH 2 % EX PADS
6.0000 | MEDICATED_PAD | Freq: Every day | CUTANEOUS | Status: DC
  Administered 2016-04-24: 6 via TOPICAL

## 2016-04-24 MED ORDER — LACTATED RINGERS IV SOLN
INTRAVENOUS | Status: DC | PRN
  Administered 2016-04-24 (×2): via INTRAVENOUS

## 2016-04-24 MED ORDER — PHENYLEPHRINE 40 MCG/ML (10ML) SYRINGE FOR IV PUSH (FOR BLOOD PRESSURE SUPPORT)
PREFILLED_SYRINGE | INTRAVENOUS | Status: AC
  Filled 2016-04-24: qty 10

## 2016-04-24 MED ORDER — DEXAMETHASONE SODIUM PHOSPHATE 10 MG/ML IJ SOLN
INTRAMUSCULAR | Status: AC
  Filled 2016-04-24: qty 1

## 2016-04-24 MED ORDER — SUGAMMADEX SODIUM 200 MG/2ML IV SOLN
INTRAVENOUS | Status: AC
  Filled 2016-04-24: qty 2

## 2016-04-24 MED ORDER — OXYCODONE HCL 5 MG/5ML PO SOLN
5.0000 mg | Freq: Once | ORAL | Status: DC | PRN
  Filled 2016-04-24: qty 5

## 2016-04-24 MED ORDER — PROPOFOL 10 MG/ML IV BOLUS
INTRAVENOUS | Status: DC | PRN
  Administered 2016-04-24: 200 mg via INTRAVENOUS

## 2016-04-24 MED ORDER — CEPHALEXIN 500 MG PO CAPS: 500.0000 mg | ORAL_CAPSULE | Freq: Two times a day (BID) | ORAL | Status: DC

## 2016-04-24 MED ORDER — DIPHENHYDRAMINE HCL 50 MG/ML IJ SOLN
INTRAMUSCULAR | Status: DC | PRN
  Administered 2016-04-24: 12.5 mg via INTRAVENOUS

## 2016-04-24 MED ORDER — HYDROMORPHONE HCL 1 MG/ML IJ SOLN
INTRAMUSCULAR | Status: DC | PRN
  Administered 2016-04-24 (×2): 0.5 mg via INTRAVENOUS
  Administered 2016-04-24: 1 mg via INTRAVENOUS

## 2016-04-24 MED ORDER — OXYCODONE-ACETAMINOPHEN 5-325 MG PO TABS: 1.0000 | ORAL_TABLET | Freq: Four times a day (QID) | ORAL | Status: DC | PRN

## 2016-04-24 MED ORDER — MEPERIDINE HCL 50 MG/ML IJ SOLN: 6.2500 mg | INTRAMUSCULAR | Status: DC | PRN

## 2016-04-24 MED ORDER — DEXAMETHASONE SODIUM PHOSPHATE 4 MG/ML IJ SOLN
INTRAMUSCULAR | Status: DC | PRN
  Administered 2016-04-24: 10 mg via INTRAVENOUS

## 2016-04-24 MED ORDER — SODIUM CHLORIDE 0.9 % IR SOLN
Status: DC | PRN
  Administered 2016-04-24 (×3): 3000 mL

## 2016-04-24 MED ORDER — MIDAZOLAM HCL 2 MG/2ML IJ SOLN
INTRAMUSCULAR | Status: AC
  Filled 2016-04-24: qty 2

## 2016-04-24 MED ORDER — LIDOCAINE HCL (CARDIAC) 20 MG/ML IV SOLN
INTRAVENOUS | Status: AC
  Filled 2016-04-24: qty 5

## 2016-04-24 MED ORDER — SCOPOLAMINE 1 MG/3DAYS TD PT72
MEDICATED_PATCH | TRANSDERMAL | Status: AC
  Filled 2016-04-24: qty 1

## 2016-04-24 MED ORDER — ONDANSETRON HCL 4 MG/2ML IJ SOLN
INTRAMUSCULAR | Status: DC | PRN
  Administered 2016-04-24: 4 mg via INTRAVENOUS

## 2016-04-24 MED ORDER — FENTANYL CITRATE (PF) 250 MCG/5ML IJ SOLN
INTRAMUSCULAR | Status: AC
  Filled 2016-04-24: qty 5

## 2016-04-24 MED ORDER — ROCURONIUM BROMIDE 100 MG/10ML IV SOLN
INTRAVENOUS | Status: DC | PRN
  Administered 2016-04-24: 10 mg via INTRAVENOUS
  Administered 2016-04-24: 50 mg via INTRAVENOUS
  Administered 2016-04-24: 10 mg via INTRAVENOUS

## 2016-04-24 MED ORDER — SENNOSIDES-DOCUSATE SODIUM 8.6-50 MG PO TABS: 1.0000 | ORAL_TABLET | Freq: Two times a day (BID) | ORAL | Status: DC

## 2016-04-24 MED ORDER — FENTANYL CITRATE (PF) 100 MCG/2ML IJ SOLN
INTRAMUSCULAR | Status: DC | PRN
  Administered 2016-04-24 (×5): 50 ug via INTRAVENOUS

## 2016-04-24 MED ORDER — PHENYLEPHRINE HCL 10 MG/ML IJ SOLN
INTRAMUSCULAR | Status: DC | PRN
  Administered 2016-04-24 (×2): 40 ug via INTRAVENOUS

## 2016-04-24 MED ORDER — GENTAMICIN SULFATE 40 MG/ML IJ SOLN
360.0000 mg | Freq: Once | INTRAVENOUS | Status: AC
  Administered 2016-04-24: 360 mg via INTRAVENOUS
  Filled 2016-04-24: qty 9

## 2016-04-24 MED ORDER — HYDROMORPHONE HCL 2 MG/ML IJ SOLN
INTRAMUSCULAR | Status: AC
  Filled 2016-04-24: qty 1

## 2016-04-24 MED ORDER — SUGAMMADEX SODIUM 200 MG/2ML IV SOLN
INTRAVENOUS | Status: DC | PRN
  Administered 2016-04-24: 200 mg via INTRAVENOUS

## 2016-04-24 MED ORDER — ROCURONIUM BROMIDE 100 MG/10ML IV SOLN
INTRAVENOUS | Status: AC
  Filled 2016-04-24: qty 1

## 2016-04-24 SURGICAL SUPPLY — 57 items
BAG URINE DRAINAGE (UROLOGICAL SUPPLIES) ×8 IMPLANT
BAG URO CATCHER STRL LF (MISCELLANEOUS) ×4 IMPLANT
BASKET LASER NITINOL 1.9FR (BASKET) ×4 IMPLANT
BASKET ZERO TIP NITINOL 2.4FR (BASKET) ×4 IMPLANT
BENZOIN TINCTURE PRP APPL 2/3 (GAUZE/BANDAGES/DRESSINGS) ×8 IMPLANT
BLADE SURG 15 STRL LF DISP TIS (BLADE) ×3 IMPLANT
BLADE SURG 15 STRL SS (BLADE) ×1
CATH FOLEY 2W COUNCIL 20FR 5CC (CATHETERS) IMPLANT
CATH FOLEY 2WAY SLVR  5CC 16FR (CATHETERS) ×1
CATH FOLEY 2WAY SLVR 5CC 16FR (CATHETERS) ×3 IMPLANT
CATH IMAGER II 65CM (CATHETERS) ×4 IMPLANT
CATH INTERMIT  6FR 70CM (CATHETERS) ×4 IMPLANT
CATH ROBINSON RED A/P 20FR (CATHETERS) IMPLANT
CATH X-FORCE N30 NEPHROSTOMY (TUBING) IMPLANT
CHLORAPREP W/TINT 26ML (MISCELLANEOUS) ×4 IMPLANT
COVER SURGICAL LIGHT HANDLE (MISCELLANEOUS) ×4 IMPLANT
DRAPE C-ARM 42X120 X-RAY (DRAPES) ×4 IMPLANT
DRAPE LINGEMAN PERC (DRAPES) ×4 IMPLANT
DRAPE SHEET LG 3/4 BI-LAMINATE (DRAPES) ×4 IMPLANT
DRAPE SURG IRRIG POUCH 19X23 (DRAPES) ×4 IMPLANT
DRSG PAD ABDOMINAL 8X10 ST (GAUZE/BANDAGES/DRESSINGS) ×8 IMPLANT
DRSG TEGADERM 8X12 (GAUZE/BANDAGES/DRESSINGS) ×4 IMPLANT
FIBER LASER FLEXIVA 1000 (UROLOGICAL SUPPLIES) IMPLANT
FIBER LASER FLEXIVA 365 (UROLOGICAL SUPPLIES) IMPLANT
FIBER LASER FLEXIVA 550 (UROLOGICAL SUPPLIES) IMPLANT
FIBER LASER TRAC TIP (UROLOGICAL SUPPLIES) ×4 IMPLANT
GAUZE SPONGE 4X4 12PLY STRL (GAUZE/BANDAGES/DRESSINGS) ×4 IMPLANT
GLOVE BIOGEL M STRL SZ7.5 (GLOVE) ×12 IMPLANT
GOWN STRL REUS W/TWL LRG LVL3 (GOWN DISPOSABLE) ×8 IMPLANT
GUIDEWIRE AMPLAZ .035X145 (WIRE) ×8 IMPLANT
GUIDEWIRE ANG ZIPWIRE 038X150 (WIRE) ×8 IMPLANT
GUIDEWIRE STR DUAL SENSOR (WIRE) IMPLANT
IV SET EXTENSION CATH 6 NF (IV SETS) IMPLANT
KIT BASIN OR (CUSTOM PROCEDURE TRAY) ×4 IMPLANT
MANIFOLD NEPTUNE II (INSTRUMENTS) ×4 IMPLANT
NEEDLE TROCAR 18X15 ECHO (NEEDLE) IMPLANT
NEEDLE TROCAR 18X20 (NEEDLE) IMPLANT
NS IRRIG 1000ML POUR BTL (IV SOLUTION) IMPLANT
PACK CYSTO (CUSTOM PROCEDURE TRAY) ×4 IMPLANT
PROBE LITHOCLAST ULTRA 3.8X403 (UROLOGICAL SUPPLIES) IMPLANT
PROBE PNEUMATIC 1.0MMX570MM (UROLOGICAL SUPPLIES) IMPLANT
SET IRRIG Y TYPE TUR BLADDER L (SET/KITS/TRAYS/PACK) IMPLANT
SHEATH PEELAWAY SET 9 (SHEATH) IMPLANT
SPONGE LAP 4X18 X RAY DECT (DISPOSABLE) ×4 IMPLANT
STENT CONTOUR 6FRX26X.038 (STENTS) ×8 IMPLANT
STONE CATCHER W/TUBE ADAPTER (UROLOGICAL SUPPLIES) ×4 IMPLANT
SUT SILK 2 0 30  PSL (SUTURE)
SUT SILK 2 0 30 PSL (SUTURE) IMPLANT
SUT VIC AB 2-0 UR6 27 (SUTURE) ×4 IMPLANT
SYR 20CC LL (SYRINGE) ×4 IMPLANT
SYR 50ML LL SCALE MARK (SYRINGE) ×4 IMPLANT
SYRINGE 10CC LL (SYRINGE) ×4 IMPLANT
TOWEL OR 17X26 10 PK STRL BLUE (TOWEL DISPOSABLE) ×4 IMPLANT
TUBE FEEDING 8FR 16IN STR KANG (MISCELLANEOUS) IMPLANT
TUBING CONNECTING 10 (TUBING) ×8 IMPLANT
WATER STERILE IRR 1500ML POUR (IV SOLUTION) IMPLANT
WATER STERILE IRR 3000ML UROMA (IV SOLUTION) IMPLANT

## 2016-04-24 NOTE — Progress Notes (Signed)
Day of Surgery  Subjective:  1 - Right > Left Nephrolithiasis - s/p right first stage percutaneous stone surgery and left ureteral stent placement 6/28. Vast majority of right sided stone removed.   Today Jennifer Hebert is seen to proceed with second stage stone surgery with goal of stone free. No interval fevers. Less anxious today.     Objective: Vital signs in last 24 hours: Temp:  [98.4 F (36.9 C)-99.5 F (37.5 C)] 99.5 F (37.5 C) (06/30 0545) Pulse Rate:  [66-88] 88 (06/30 0545) Resp:  [18] 18 (06/30 0545) BP: (129-148)/(79-93) 132/93 mmHg (06/30 0545) SpO2:  [98 %-100 %] 100 % (06/30 0545) Last BM Date: 04/22/16  Intake/Output from previous day: 06/29 0701 - 06/30 0700 In: 780 [P.O.:480; I.V.:300] Out: 2400 [Urine:2400] Intake/Output this shift:    General appearance: alert, cooperative and appears stated age Eyes: negative Nose: Nares normal. Septum midline. Mucosa normal. No drainage or sinus tenderness. Throat: lips, mucosa, and tongue normal; teeth and gums normal Neck: supple, symmetrical, trachea midline Back: symmetric, no curvature. ROM normal. No CVA tenderness. Resp: non-labored on room air Cardio: Nl rate GI: soft, non-tender; bowel sounds normal; no masses,  no organomegaly Extremities: extremities normal, atraumatic, no cyanosis or edema Pulses: 2+ and symmetric Lymph nodes: Cervical, supraclavicular, and axillary nodes normal. Neurologic: Grossly normal Incision/Wound: Rt neph tueb with yellow urine. No flank hematomas.   Lab Results:   Recent Labs  04/22/16 1333 04/23/16 0458  HGB 13.9 12.7  HCT 40.2 37.9   BMET  Recent Labs  04/23/16 0458  NA 141  K 3.8  CL 110  CO2 25  GLUCOSE 121*  BUN 7  CREATININE 0.77  CALCIUM 8.6*   PT/INR No results for input(s): LABPROT, INR in the last 72 hours. ABG No results for input(s): PHART, HCO3 in the last 72 hours.  Invalid input(s): PCO2, PO2  Studies/Results: Dg Retrograde  Pyelogram  04/22/2016  CLINICAL DATA:  25 year old female undergoing right percutaneous nephrolithotomy and ureteral stent placement. EXAM: RETROGRADE PYELOGRAM; DG C-ARM 1-60 MIN-NO REPORT COMPARISON:  CT abdomen/ pelvis 03/13/2016 FINDINGS: A total of 13 intraoperative saved images demonstrate catheterization of the left ureteral orifice with retrograde ureteropyelogram. The left renal collecting system is near normal. There is a focal filling defect in the lower pole consistent with the presence of a small stone. No hydronephrosis or urothelial lesion. A double-J ureteral stent was then placed. Subsequently, the right ureteral orifice is catheterized and retrograde ureteropyelogram is performed. There is moderate right-sided hydronephrosis secondary to a partially obstructing stone in the renal pelvis. Subsequent images demonstrate placement of a percutaneous nephrostomy tube into the upper pole infundibulum on the right and a 5 [place] safety catheter extending into the proximal ureter. IMPRESSION: 1. At least partially obstructing stone in the right renal pelvis. 2. Percutaneous nephrolithotomy with a percutaneous nephrostomy tube left in the upper pole infundibulum and a 5 [place] safety catheter in the ureter. 3. Placement of a left-sided double-J ureteral stent. Electronically Signed   By: Malachy MoanHeath  McCullough M.D.   On: 04/22/2016 14:30   Dg C-arm 1-60 Min-no Report  04/22/2016  CLINICAL DATA: kidney stones C-ARM 1-60 MINUTES Fluoroscopy was utilized by the requesting physician.  No radiographic interpretation.    Anti-infectives: Anti-infectives    Start     Dose/Rate Route Frequency Ordered Stop   04/24/16 0630  gentamicin (GARAMYCIN) 360 mg in dextrose 5 % 100 mL IVPB     360 mg 109 mL/hr over 60 Minutes Intravenous  Once 04/24/16 0547     04/22/16 0000  gentamicin (GARAMYCIN) 360 mg in dextrose 5 % 100 mL IVPB     360 mg 218 mL/hr over 30 Minutes Intravenous 30 min pre-op 04/21/16 1349  04/22/16 1035      Assessment/Plan:  1 - Right > Left Nephrolithiasis - Proceed as planned with Rt 2nd stage percutanoues and Left retrograde approach stone surgery with goal of stone free. Risks, benefits, alternatives, expected peri-op course reiterated.   Jennifer Hebert, Jennifer Hebert 04/24/2016

## 2016-04-24 NOTE — Transfer of Care (Signed)
Immediate Anesthesia Transfer of Care Note  Patient: Jennifer Hebert  Procedure(s) Performed: Procedure(s): 2ND STAGE NEPHROLITHOTOMY PERCUTANEOUS (Right) CYSTOSCOPY WITH URETEROSCOPY (Left) HOLMIUM LASER APPLICATION (Bilateral) CYSTOSCOPY WITH STENT REPLACEMENT (Bilateral)  Patient Location: PACU  Anesthesia Type:General  Level of Consciousness: Patient easily awoken, sedated, comfortable, cooperative, following commands, responds to stimulation.   Airway & Oxygen Therapy: Patient spontaneously breathing, ventilating well, oxygen via simple oxygen mask.  Post-op Assessment: Report given to PACU RN, vital signs reviewed and stable, moving all extremities.   Post vital signs: Reviewed and stable.  Complications: No apparent anesthesia complications

## 2016-04-24 NOTE — Anesthesia Postprocedure Evaluation (Signed)
Anesthesia Post Note  Patient: Lelon HuhConnor B Betley  Procedure(s) Performed: Procedure(s) (LRB): 2ND STAGE NEPHROLITHOTOMY PERCUTANEOUS (Right) CYSTOSCOPY WITH URETEROSCOPY (Left) HOLMIUM LASER APPLICATION (Bilateral) CYSTOSCOPY WITH STENT REPLACEMENT (Bilateral)  Patient location during evaluation: PACU Anesthesia Type: General Level of consciousness: awake and alert Pain management: pain level controlled Vital Signs Assessment: post-procedure vital signs reviewed and stable Respiratory status: spontaneous breathing, nonlabored ventilation and respiratory function stable Cardiovascular status: blood pressure returned to baseline and stable Postop Assessment: no signs of nausea or vomiting Anesthetic complications: no    Last Vitals:  Filed Vitals:   04/24/16 0945 04/24/16 1012  BP: 134/89 149/89  Pulse: 87 97  Temp: 36.7 C 37 C  Resp: 15 15    Last Pain:  Filed Vitals:   04/24/16 1019  PainSc: 0-No pain                 Klark Vanderhoef A

## 2016-04-24 NOTE — Anesthesia Procedure Notes (Signed)
Procedure Name: Intubation Performed by: Ludwig LeanJONES, Azelea Seguin C Pre-anesthesia Checklist: Patient identified, Emergency Drugs available, Suction available and Patient being monitored Patient Re-evaluated:Patient Re-evaluated prior to inductionOxygen Delivery Method: Circle system utilized Preoxygenation: Pre-oxygenation with 100% oxygen Intubation Type: IV induction Ventilation: Mask ventilation without difficulty Laryngoscope Size: Mac and 3 Grade View: Grade I Tube type: Oral Tube size: 7.5 mm Number of attempts: 1 Airway Equipment and Method: Oral airway Placement Confirmation: ETT inserted through vocal cords under direct vision,  positive ETCO2 and breath sounds checked- equal and bilateral Secured at: 21 cm Tube secured with: Tape Dental Injury: Teeth and Oropharynx as per pre-operative assessment

## 2016-04-24 NOTE — Discharge Instructions (Signed)
1 - You may have urinary urgency (bladder spasms) and bloody urine on / off with stent in place. This is normal. ° °2 - Call MD or go to ER for fever >102, severe pain / nausea / vomiting not relieved by medications, or acute change in medical status ° °

## 2016-04-24 NOTE — Anesthesia Preprocedure Evaluation (Signed)
Anesthesia Evaluation  Patient identified by MRN, date of birth, ID band Patient awake    Reviewed: Allergy & Precautions, NPO status , Patient's Chart, lab work & pertinent test results  Airway Mallampati: I  TM Distance: >3 FB Neck ROM: Full    Dental  (+) Teeth Intact, Dental Advisory Given   Pulmonary former smoker,    breath sounds clear to auscultation       Cardiovascular  Rhythm:Regular Rate:Normal     Neuro/Psych    GI/Hepatic GERD  Medicated and Controlled,  Endo/Other  Morbid obesity  Renal/GU      Musculoskeletal   Abdominal   Peds  Hematology   Anesthesia Other Findings   Reproductive/Obstetrics                             Anesthesia Physical Anesthesia Plan  ASA: II  Anesthesia Plan: General   Post-op Pain Management:    Induction: Intravenous  Airway Management Planned: LMA  Additional Equipment:   Intra-op Plan:   Post-operative Plan: Extubation in OR  Informed Consent: I have reviewed the patients History and Physical, chart, labs and discussed the procedure including the risks, benefits and alternatives for the proposed anesthesia with the patient or authorized representative who has indicated his/her understanding and acceptance.   Dental advisory given  Plan Discussed with: CRNA, Anesthesiologist and Surgeon  Anesthesia Plan Comments:         Anesthesia Quick Evaluation

## 2016-04-24 NOTE — Brief Op Note (Signed)
04/22/2016 - 04/24/2016  9:18 AM  PATIENT:  Jennifer Hebert  24 y.o. female  PRE-OPERATIVE DIAGNOSIS:  RIGHT STAGHORN CALCULUS, LEFT RENAL CALCULUS  POST-OPERATIVE DIAGNOSIS:  RIGHT STAGHORN CALCULUS, LEFT RENAL CALCULUS  PROCEDURE:  Procedure(s): 2ND STAGE NEPHROLITHOTOMY PERCUTANEOUS (Right) CYSTOSCOPY WITH URETEROSCOPY (Left) HOLMIUM LASER APPLICATION (Bilateral) CYSTOSCOPY WITH STENT REPLACEMENT (Bilateral)  SURGEON:  Surgeon(s) and Role:    * Sebastian Acheheodore Lekisha Mcghee, MD - Primary  PHYSICIAN ASSISTANT:   ASSISTANTS: none   ANESTHESIA:   general  EBL:  Total I/O In: 1000 [I.V.:1000] Out: 20 [Blood:20]  BLOOD ADMINISTERED:none  DRAINS: foley to gravity   LOCAL MEDICATIONS USED:  NONE  SPECIMEN:  Source of Specimen:  bilateral renal stone fragments  DISPOSITION OF SPECIMEN:  given to patient  COUNTS:  YES  TOURNIQUET:  * No tourniquets in log *  DICTATION: .Other Dictation: Dictation Number  [id number]  PLAN OF CARE: Admit to inpatient   PATIENT DISPOSITION:  PACU - hemodynamically stable.   Delay start of Pharmacological VTE agent (>24hrs) due to surgical blood loss or risk of bleeding: yes

## 2016-04-25 LAB — GLUCOSE, CAPILLARY: GLUCOSE-CAPILLARY: 92 mg/dL (ref 65–99)

## 2016-04-25 NOTE — Op Note (Signed)
NAMMarland Kitchen:  Nicholes RoughFONVIELLE, Jull            ACCOUNT NO.:  [id number]  MEDICAL RECORD NO.:  [id number]  LOCATION:  1434                         [facility]  PHYSICIAN:  Sebastian Acheheodore Jancarlos Thrun, MD     DATE OF BIRTH:  [birth year]  DATE OF PROCEDURE: 04/24/2016                              OPERATIVE REPORT   DIAGNOSES: 1. Residual, partial right staghorn kidney stone. 2. Small left intrarenal stone.  PROCEDURES: 1. Right second-stage percutaneous nephrostolithotomy stone, less than     2 cm. 2. Right ureteral stent change. 3. Right antegrade nephrostogram with interpretation. 4. Left ureteroscopy with laser lithotripsy. 5. Exchange of left ureteral stent.  FINDINGS: 1. Small volume residual right intrarenal stone, total volume     approximately 6 mm. 2. No evidence of right ureteral stones. 3. Successful replacement of right ureteral stent, 6 x 26 Contour,     proximal in the renal pelvis and distal in the urinary bladder. 4. Unremarkable left retrograde pyelogram. 5. Small left inferior renal stone approximately 7 mm. 6. Complete resolution of all stone fragments, larger than 1/3rd mm     following laser lithotripsy and basket extraction. 7. Successful placement of left ureteral stent, 6 x 26 Contour.  ESTIMATED BLOOD LOSS:  20 mL.  DRAINS:  Foley catheter to straight drain.  INDICATION:  Ms. Jennifer Hebert is a pleasant 25 year old young woman, who was found recently to have a right partial staghorn kidney stone, small left intrarenal stone.  She is fertile, she does desire elective pregnancies in the future and she also has a chronic right hydronephrosis, preserved parenchyma, this clearly felt that a goal of stone free was warranted and she wished to proceed.  She underwent first- stage right percutaneous nephrostolithotomy on April 22, 2016, but the majority of right-sided stone was removed.  She now presents for second- stage right and left-sided ureteroscopy for goal of stone  free. Informed consent was obtained and placed in the medical record.  PROCEDURE IN DETAIL:  The patient being Lavell AnchorsConnor Mendenhall, was verified. Procedure being right second-stage left ureteroscopy was confirmed. Procedure was carried out.  Time-out was performed.  Intravenous antibiotics were administered.  General endotracheal anesthesia was introduced.  The patient was placed into a modified lithotomy position employing a large gel roll under her right flank allowing access to both her perineum as well as her right nephrostomy tubes.  Her right arm was placed over her chest on pillows and carefully taped into place.  Two sterile fields were then created, first of the right flank using chlorhexidine gluconate and the vagina, introitus and proximal thighs using iodine.  Foley catheter was placed per urethra to straight drain. It was infiltrated at the right side, initial right antegrade nephrostogram.  The in-situ nephrostomy tube revealed excellent opacification of the right kidney and ureter without any medial extravasation.  The in-situ nephroureteral stent was left in place as a safety wire.  The nephrostomy tube was removed.  Flexible nephroscopy was performed using a 16-French flexible cystoscope.  All calices were inspected x2-3.  There were several small intrarenal stones, total volume approximately 6 mm.  These were amenable to basketing, they were removed and set aside for compositional analysis.  Repeat endoscopic  examination revealed complete resolution of all stone fragments larger than 1/3rd mm in the right kidney.  The UPJ was directly visualized and visibly intact.  A 0.038 Zip wire was advanced in antegrade fashion to the level of the urinary bladder.  A Sensor wire was placed through the nephroureteral stent.  Nephroureteral stent was removed and the flexible digital ureteroscope was advanced over the working wire to the level of the urinary bladder and flexible  ureteroscopy was performed to the entire length of the right ureter.  This revealed no significant mucosal abnormalities and no stones as we verified right side clinically stone free.  We achieved the goals of the right-sided procedure.  A final 6 x 26 Contour-type stent was placed in antegrade fashion using fluoroscopic guidance, good proximal and distal deployment were noted.  Attention was then directed at the left side.  The in-situ Foley catheter was removed. Rigid cystoscopy was performed.  The distal end of the right stent was visualized and in good position.  The distal end of the left stent was grasped proximal at the level of the urethral meatus.  Through which, a 0.038 Zip wire was advanced to the level of the upper pole, set aside as a safety wire.  Next, semi-rigid ureteroscopy was performed to the distal four-fifths of the left ureter, alongside, a separate Sensor working wire.  No mucosal abnormalities were found.  The semi-rigid scope was exchanged for a 12/14, 24-cm ureteral access sheath at the level of the proximal ureter using fluoroscopic guidance.  Next, flexible digital ureteroscopy was performed on the left side, following inspection of the proximal left ureter and systematic inspection of the left kidney including all calices x3.  A single dominant lower mid-pole stone was noted, this was consistent with prior imaging and this appeared to be too large for simple basketing.  As such, holmium laser energy applied to the stone using settings of 0.2 joules and 20 Hz fragmenting the stone into pieces, approximately 1-2 mm.  These were then sequentially grasped with an Escape basket and removed, and set aside.  Following this, there was complete resolution of all stone fragments, larger than 1/3rd mm.  No evidence of renal perforation.  The sheath was removed under continuous vision.  No mucosal abnormalities were found.  Final left retrograde pyelogram revealed no  evidence of extravasation and a new 6 x 26 Contour-type stent was placed using fluoroscopic guidance on the left.  Good proximal and distal deployment were noted.  Foley catheter was then replaced, connected to straight drain.  A 10 mL of sterile water in the balloon.  The right flank side was completely hemostatic, it was nondraining and was closed at the level of the skin using interrupted Vicryl x3.  Dry dressing applied. Procedure was terminated.  The patient tolerated the procedure well. There were no immediate periprocedural complications.  The patient was taken to the postanesthesia care unit in stable condition.          ______________________________ Sebastian Acheheodore Esraa Seres, MD     TM/MEDQ  D:  04/24/2016  T:  04/25/2016  Job:  [id number]

## 2016-04-25 NOTE — Discharge Summary (Signed)
Physician Discharge Summary  Patient ID: Jennifer Hebert MRN: [id number] DOB/AGE: 1991/07/22 24 y.o.  Admit date: 04/22/2016 Discharge date: 04/25/2016  Admission Diagnoses: Right staghorn calculus and left renal calculi  Discharge Diagnoses:  Same  Discharged Condition: good  Hospital Course: She was admitted for a percutaneous nephrolithotomy due to a right staghorn calculus. The procedure was planned to be staged and she underwent a first stage with percutaneous nephrolithotomy on 04/22/16. She tolerated this procedure well and 2 days later she underwent right ureteroscopy with removal of remaining fragments. She also had small left renal calculi and therefore also underwent left ureteroscopy and removal of her left renal calculi. She had bilateral double-J stents left in place. She has done well. She has no flank pain, nausea, vomiting and is tolerating regular diet. She has no voiding symptoms. She is felt ready for discharge at this time.   Discharge Exam: Blood pressure 144/89, pulse 88, temperature 98.3 F (36.8 C), temperature source Oral, resp. rate 16, height 5\' 5"  (1.651 m), weight 98.884 kg (218 lb), last menstrual period 04/16/2016, SpO2 99 %. General appearance: alert, cooperative and no distress  Abdomen is soft. Flank without ecchymoses or significant tenderness.  Disposition: 01-Home or Self Care     Medication List    STOP taking these medications        HYDROcodone-acetaminophen 5-325 MG tablet  Commonly known as:  NORCO/VICODIN     ibuprofen 200 MG tablet  Commonly known as:  ADVIL,MOTRIN     ondansetron 4 MG disintegrating tablet  Commonly known as:  ZOFRAN ODT      TAKE these medications        acetaminophen 325 MG tablet  Commonly known as:  TYLENOL  Take 650 mg by mouth every 6 (six) hours as needed for moderate pain or headache.     cephALEXin 500 MG capsule  Commonly known as:  KEFLEX  Take 1 capsule (500 mg total) by mouth 2 (two) times  daily. X 3 days. Begin day before next Urology appointment.     oxyCODONE-acetaminophen 5-325 MG tablet  Commonly known as:  ROXICET  Take 1-2 tablets by mouth every 6 (six) hours as needed for moderate pain or severe pain. Post-operatively.     senna-docusate 8.6-50 MG tablet  Commonly known as:  Senokot-S  Take 1 tablet by mouth 2 (two) times daily. While taking pain meds to prevent constipation           Follow-up Information    Follow up with Sebastian AcheMANNY, THEODORE, MD On 05/11/2016.   Specialty:  Urology   Why:  at 9:45 AM for MD visit and office stent removal.   Contact information:   [address] [place] [address] [phone]       Signed: Garnett FarmOTTELIN,Jennifer Giovanni C 04/25/2016, 9:02 AM

## 2016-04-26 ENCOUNTER — Encounter (HOSPITAL_COMMUNITY): Payer: Self-pay | Admitting: Urology

## 2016-05-04 NOTE — Anesthesia Postprocedure Evaluation (Signed)
Anesthesia Post Note  Patient: Lelon HuhConnor B Labreck  Procedure(s) Performed: Procedure(s) (LRB): 1ST STAGE NEPHROLITHOTOMY PERCUTANEOUS WITH SURGEON ACCESS (Right) CYSTOSCOPY WITH RETROGRADE PYELOGRAM/ RIGHT  URETERAL STENT PLACEMENT  (Bilateral)  Patient location during evaluation: PACU Anesthesia Type: General Level of consciousness: awake and alert, oriented and patient cooperative Pain management: pain level controlled Vital Signs Assessment: post-procedure vital signs reviewed and stable Respiratory status: nonlabored ventilation, spontaneous breathing and respiratory function stable Cardiovascular status: blood pressure returned to baseline and stable Postop Assessment: no signs of nausea or vomiting Anesthetic complications: no Comments: Delayed entry: pt eval post op in PACU     Last Vitals:  Filed Vitals:   04/24/16 2145 04/25/16 0500  BP: 146/104 144/89  Pulse: 76 88  Temp: 36.8 C 36.8 C  Resp: 18 16    Last Pain:  Filed Vitals:   04/25/16 1304  PainSc: 0-No pain   Pain Goal: Patients Stated Pain Goal: 3 (04/25/16 0650)               Erling CruzJACKSON,E. Jeri Jeanbaptiste

## 2016-12-07 IMAGING — CT CT RENAL STONE PROTOCOL
2 of 3 series · 16 of 46 positions shown, 18 images · non-contrast
Comparison: None.

CLINICAL DATA: Renal calculus on abdominal radiograph

EXAM:
CT ABDOMEN AND PELVIS WITHOUT CONTRAST
TECHNIQUE: Multidetector CT imaging of the abdomen and pelvis was performed
following the standard protocol without IV contrast.

[Series 3: coronal · coronal · 0.74mm/px · 3 of 158 slices shown]
[im 53/158  soft-tissue]
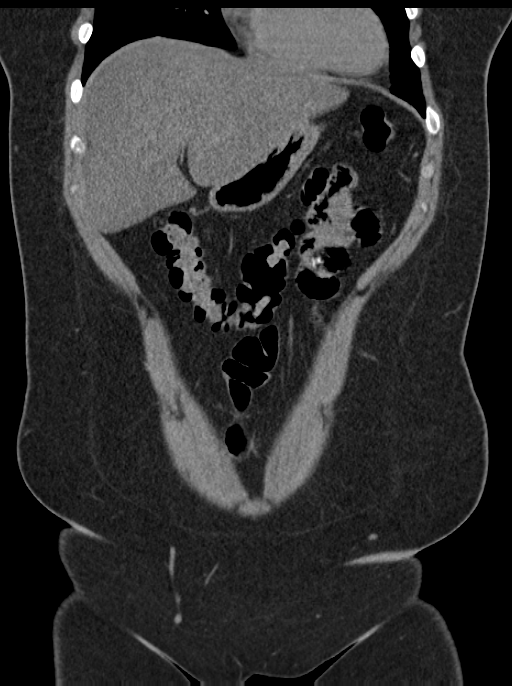
[im 70/158  soft-tissue]
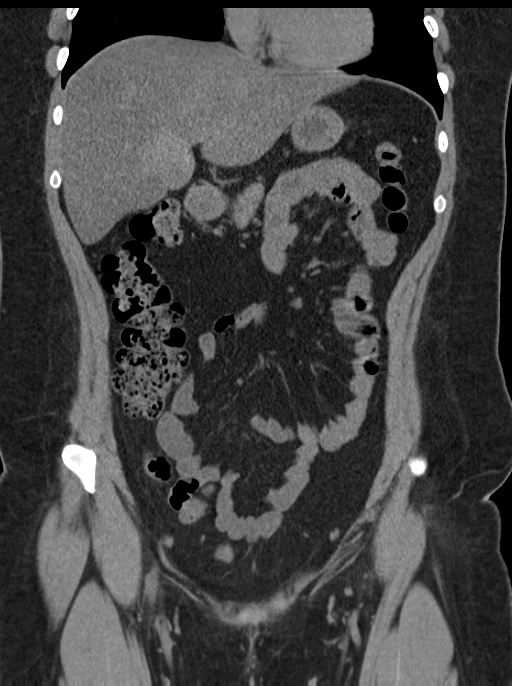
[im 88/158  soft-tissue]
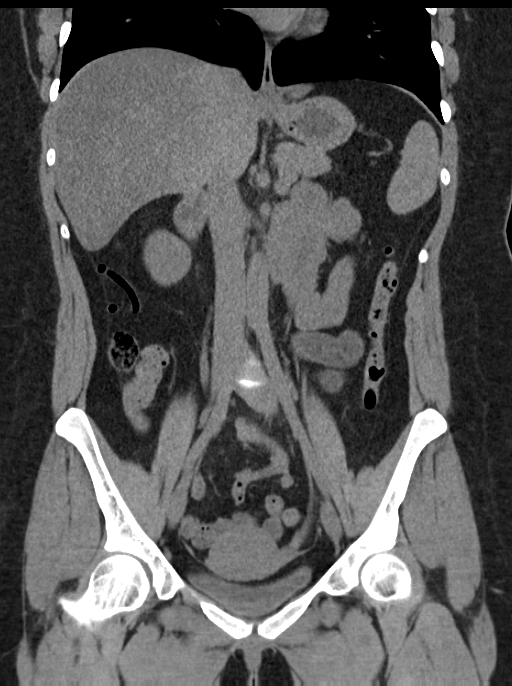

[Series 6: lung · axial · 0.74mm/px · z∈[+740,+842]mm · 13 of 59 slices shown, 15 images]
[im 4/59  soft-tissue]
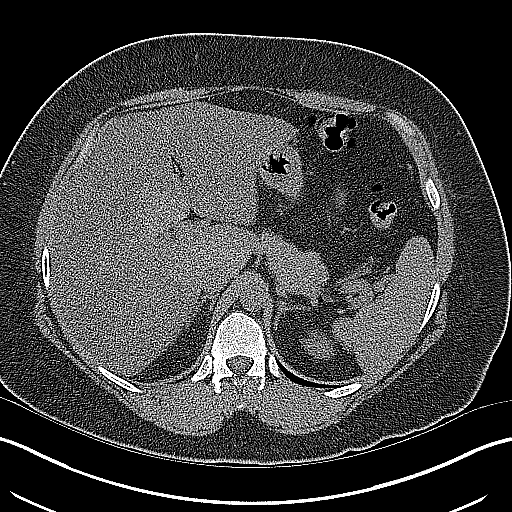
[im 4/59  bone]
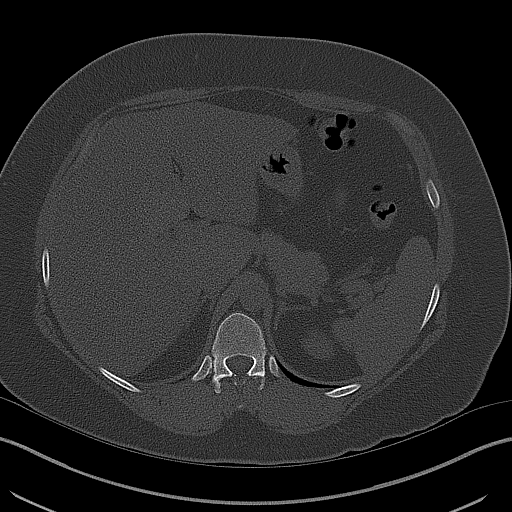
[im 8/59  soft-tissue]
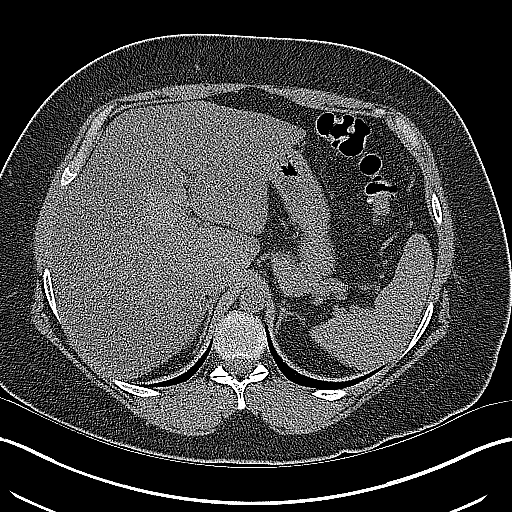
[im 12/59  soft-tissue]
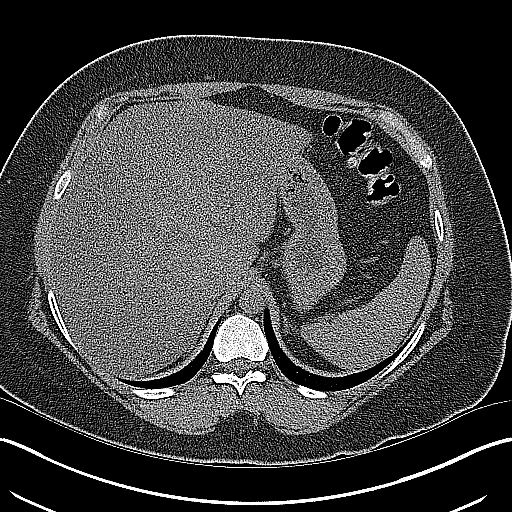
[im 17/59  soft-tissue]
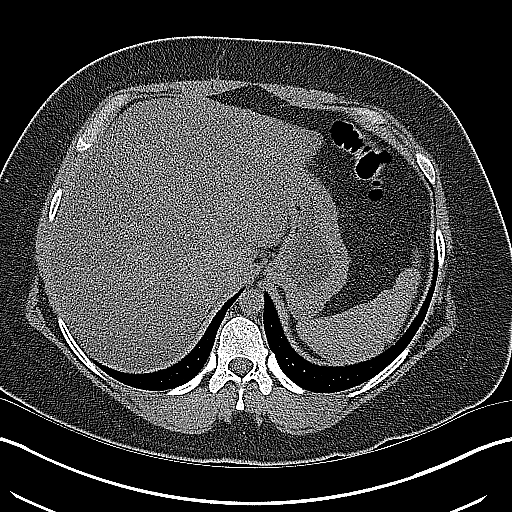
[im 21/59  soft-tissue]
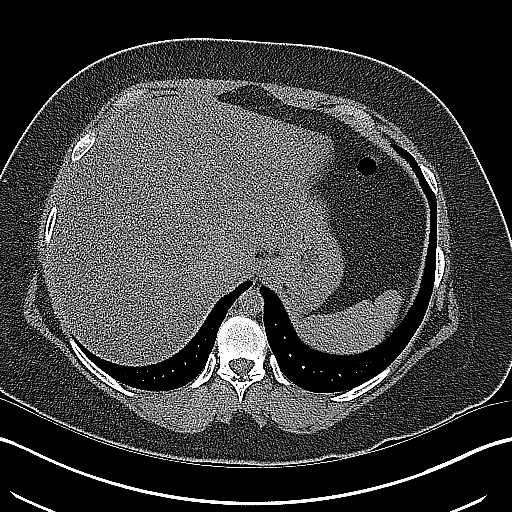
[im 25/59  soft-tissue]
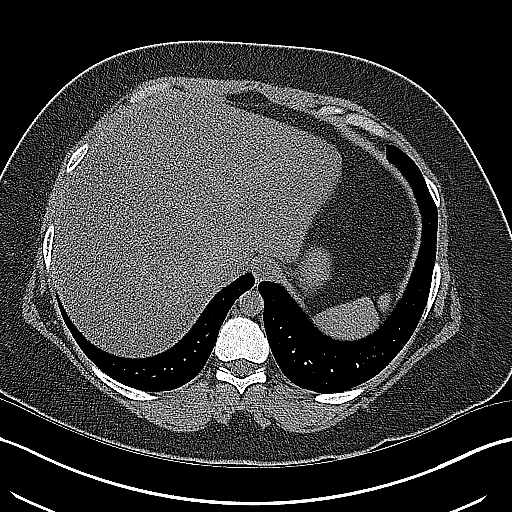
[im 30/59  soft-tissue]
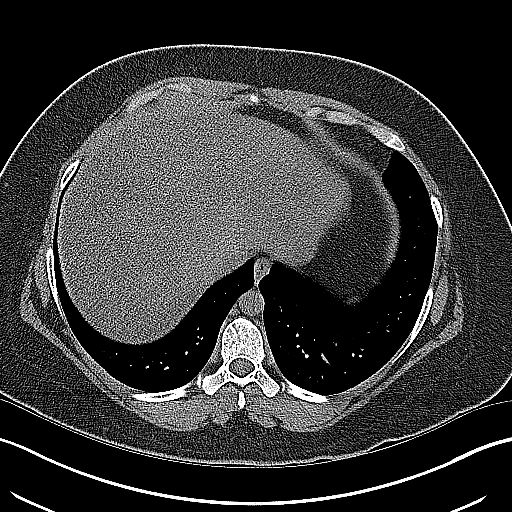
[im 34/59  soft-tissue]
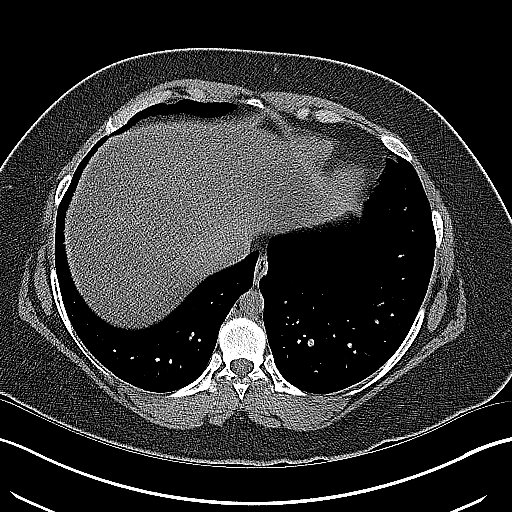
[im 38/59  soft-tissue]
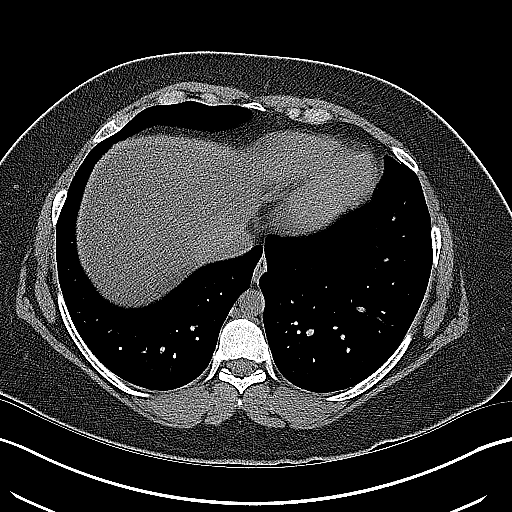
[im 38/59  bone]
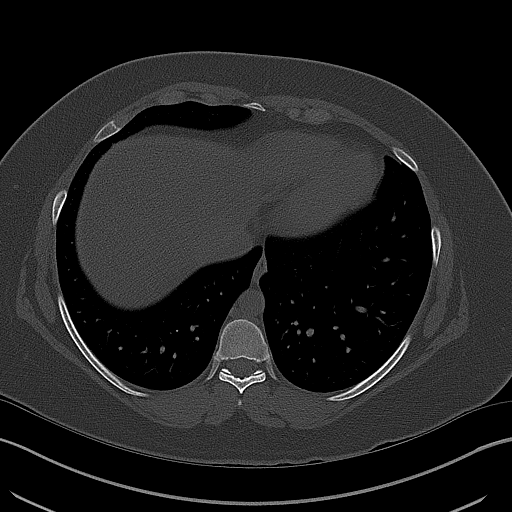
[im 42/59  soft-tissue]
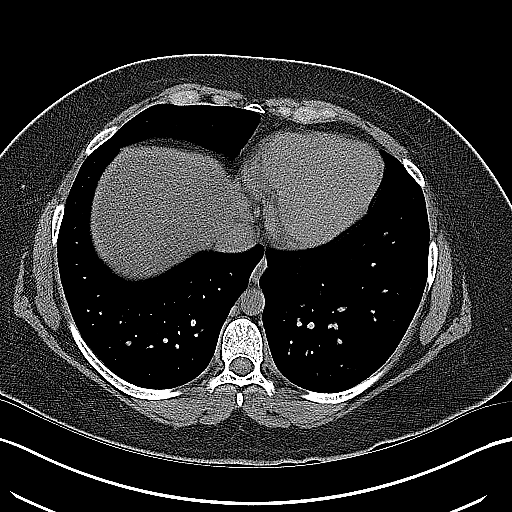
[im 47/59  soft-tissue]
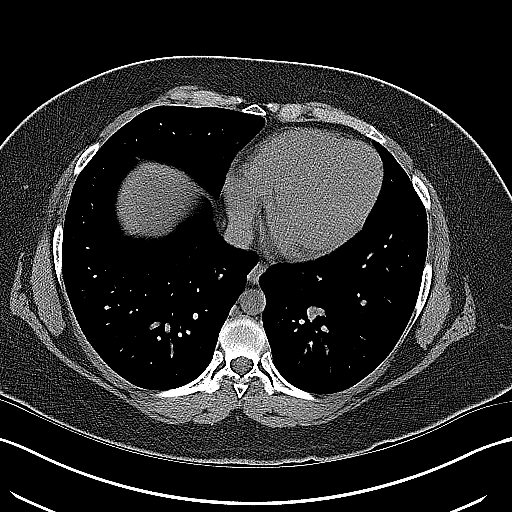
[im 51/59  soft-tissue]
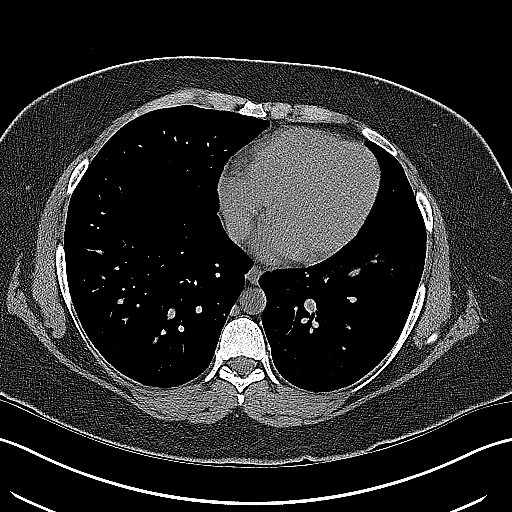
[im 55/59  soft-tissue]
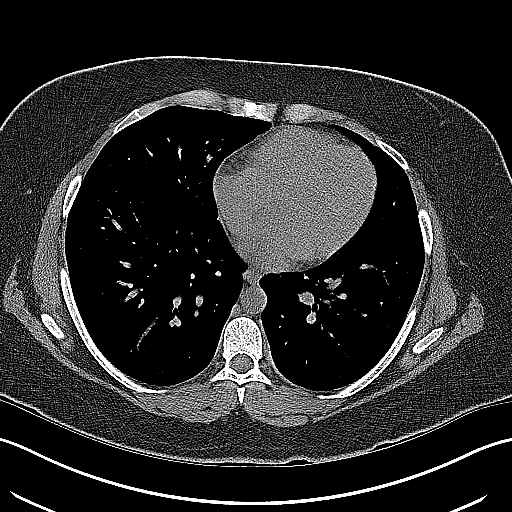

[16 of 46 positions shown; findings below may reference images not displayed]

FINDINGS: Lower chest:  Lung bases are clear.

Hepatobiliary: Moderate hepatic steatosis with areas of focal fatty
sparing.

Gallbladder is unremarkable. No intrahepatic extrahepatic ductal
dilatation.

Pancreas: Within normal limits.

Spleen: Within normal limits.

Adrenals/Urinary Tract: Adrenal glands are within normal limits.

1.5 x 2.4 cm calculus in the right renal pelvis.  No hydronephrosis.

4 mm nonobstructing calculus in the left lower kidney (series 2/
image 37). No hydronephrosis.

Bladder is within normal limits.

Stomach/Bowel: Stomach is within normal limits.

No evidence of bowel obstruction.

Normal appendix (series 2/ image 45).

Vascular/Lymphatic: No evidence of abdominal aortic aneurysm.

No suspicious abdominopelvic lymphadenopathy.

Reproductive: Uterus is within normal limits.

Bilateral ovaries are within normal limits.

Other: No abdominopelvic ascites.

Musculoskeletal: Visualized osseous structures are within normal
limits.
IMPRESSION: 1.5 x 2.4 cm calculus in the right renal pelvis, corresponding to
the radiographic abnormality. No hydronephrosis.

Additional 4 mm nonobstructing left lower pole renal calculus.

Moderate hepatic steatosis.

## 2019-10-27 NOTE — L&D Delivery Note (Signed)
Delivery Note At 12:17 PM a viable female was delivered via OA Presentation  Placenta status: spontaneously with 3 vessel cord ,  .  Cord:   with the following complications: loose nuchal cord .  Cord pH: not obtained  Anesthesia:  epidural Episiotomy:  none Lacerations:  2nd Suture Repair: 3.0 vicryl Est. Blood Loss (mL):  300  Mom to postpartum.  Baby to Couplet care / Skin to Skin.  Jeani Hawking 10/10/2020, 12:31 PM

## 2020-03-11 LAB — OB RESULTS CONSOLE RUBELLA ANTIBODY, IGM: Rubella: IMMUNE

## 2020-03-11 LAB — OB RESULTS CONSOLE HIV ANTIBODY (ROUTINE TESTING): HIV: NONREACTIVE

## 2020-03-11 LAB — OB RESULTS CONSOLE HEPATITIS B SURFACE ANTIGEN: Hepatitis B Surface Ag: NEGATIVE

## 2020-06-26 ENCOUNTER — Other Ambulatory Visit: Payer: 59

## 2020-06-26 ENCOUNTER — Other Ambulatory Visit: Payer: Self-pay

## 2020-06-26 ENCOUNTER — Other Ambulatory Visit: Payer: Self-pay | Admitting: Sleep Medicine

## 2020-06-26 DIAGNOSIS — I471 Supraventricular tachycardia: Secondary | ICD-10-CM

## 2020-06-27 LAB — NOVEL CORONAVIRUS, NAA: SARS-CoV-2, NAA: NOT DETECTED

## 2020-09-18 LAB — OB RESULTS CONSOLE GBS: GBS: NEGATIVE

## 2020-10-02 ENCOUNTER — Encounter (HOSPITAL_COMMUNITY): Payer: Self-pay | Admitting: *Deleted

## 2020-10-02 ENCOUNTER — Telehealth (HOSPITAL_COMMUNITY): Payer: Self-pay | Admitting: *Deleted

## 2020-10-02 NOTE — Telephone Encounter (Signed)
Preadmission screen  

## 2020-10-09 ENCOUNTER — Other Ambulatory Visit: Payer: Self-pay

## 2020-10-09 ENCOUNTER — Encounter (HOSPITAL_COMMUNITY): Payer: Self-pay | Admitting: Obstetrics and Gynecology

## 2020-10-09 ENCOUNTER — Inpatient Hospital Stay (HOSPITAL_COMMUNITY)
Admission: AD | Admit: 2020-10-09 | Discharge: 2020-10-13 | DRG: 807 | Disposition: A | Discharge status: Home / Self Care | Payer: 59 | Attending: Obstetrics and Gynecology | Admitting: Obstetrics and Gynecology

## 2020-10-09 DIAGNOSIS — Z87891 Personal history of nicotine dependence: Secondary | ICD-10-CM

## 2020-10-09 DIAGNOSIS — Z3A38 38 weeks gestation of pregnancy: Secondary | ICD-10-CM

## 2020-10-09 DIAGNOSIS — Z20822 Contact with and (suspected) exposure to covid-19: Secondary | ICD-10-CM | POA: Diagnosis present

## 2020-10-09 DIAGNOSIS — O134 Gestational [pregnancy-induced] hypertension without significant proteinuria, complicating childbirth: Secondary | ICD-10-CM | POA: Diagnosis present

## 2020-10-09 DIAGNOSIS — Z3490 Encounter for supervision of normal pregnancy, unspecified, unspecified trimester: Secondary | ICD-10-CM

## 2020-10-09 LAB — COMPREHENSIVE METABOLIC PANEL
ALT: 21 U/L (ref 0–44)
AST: 26 U/L (ref 15–41)
Albumin: 3.2 g/dL — ABNORMAL LOW (ref 3.5–5.0)
Alkaline Phosphatase: 136 U/L — ABNORMAL HIGH (ref 38–126)
Anion gap: 11 (ref 5–15)
BUN: 8 mg/dL (ref 6–20)
CO2: 20 mmol/L — ABNORMAL LOW (ref 22–32)
Calcium: 9.1 mg/dL (ref 8.9–10.3)
Chloride: 105 mmol/L (ref 98–111)
Creatinine, Ser: 0.56 mg/dL (ref 0.44–1.00)
GFR, Estimated: >60 mL/min (ref >60)
Glucose, Bld: 71 mg/dL (ref 70–99)
Potassium: 3.6 mmol/L (ref 3.5–5.1)
Sodium: 136 mmol/L (ref 135–145)
Total Bilirubin: 0.6 mg/dL (ref 0.3–1.2)
Total Protein: 6.6 g/dL (ref 6.5–8.1)

## 2020-10-09 LAB — CBC
HCT: 36.4 % (ref 36.0–46.0)
Hemoglobin: 12.5 g/dL (ref 12.0–15.0)
MCH: 28.7 pg (ref 26.0–34.0)
MCHC: 34.3 g/dL (ref 30.0–36.0)
MCV: 83.5 fL (ref 80.0–100.0)
Platelets: 269 10*3/uL (ref 150–400)
RBC: 4.36 MIL/uL (ref 3.87–5.11)
RDW: 14.6 % (ref 11.5–15.5)
WBC: 9.7 10*3/uL (ref 4.0–10.5)
nRBC: 0 % (ref 0.0–0.2)

## 2020-10-09 LAB — TYPE AND SCREEN
ABO/RH(D): O POS
Antibody Screen: NEGATIVE

## 2020-10-09 LAB — RESP PANEL BY RT-PCR (FLU A&B, COVID) ARPGX2
Influenza A by PCR: NEGATIVE
Influenza B by PCR: NEGATIVE
SARS Coronavirus 2 by RT PCR: NEGATIVE

## 2020-10-09 MED ORDER — BUTORPHANOL TARTRATE 1 MG/ML IJ SOLN
2.0000 mg | INTRAMUSCULAR | Status: DC | PRN
  Administered 2020-10-10: 2 mg via INTRAVENOUS
  Filled 2020-10-09: qty 2

## 2020-10-09 MED ORDER — LABETALOL HCL 5 MG/ML IV SOLN: 40.0000 mg | INTRAVENOUS | Status: DC | PRN

## 2020-10-09 MED ORDER — LACTATED RINGERS IV SOLN
500.0000 mL | INTRAVENOUS | Status: DC | PRN
  Administered 2020-10-10: 500 mL via INTRAVENOUS

## 2020-10-09 MED ORDER — MISOPROSTOL 25 MCG QUARTER TABLET
25.0000 ug | ORAL_TABLET | ORAL | Status: DC | PRN
Start: 2020-10-09 — End: 2020-10-10
  Administered 2020-10-09 – 2020-10-10 (×3): 25 ug via VAGINAL
  Filled 2020-10-09 (×2): qty 1

## 2020-10-09 MED ORDER — LACTATED RINGERS IV SOLN: INTRAVENOUS | Status: DC

## 2020-10-09 MED ORDER — OXYCODONE-ACETAMINOPHEN 5-325 MG PO TABS: 2.0000 | ORAL_TABLET | ORAL | Status: DC | PRN

## 2020-10-09 MED ORDER — LABETALOL HCL 5 MG/ML IV SOLN: 80.0000 mg | INTRAVENOUS | Status: DC | PRN

## 2020-10-09 MED ORDER — LIDOCAINE HCL (PF) 1 % IJ SOLN: 30.0000 mL | INTRAMUSCULAR | Status: DC | PRN

## 2020-10-09 MED ORDER — OXYTOCIN-SODIUM CHLORIDE 30-0.9 UT/500ML-% IV SOLN
2.5000 [IU]/h | INTRAVENOUS | Status: DC
  Administered 2020-10-10: 2.5 [IU]/h via INTRAVENOUS

## 2020-10-09 MED ORDER — OXYTOCIN BOLUS FROM INFUSION
333.0000 mL | Freq: Once | INTRAVENOUS | Status: AC
  Administered 2020-10-10: 333 mL via INTRAVENOUS

## 2020-10-09 MED ORDER — OXYCODONE-ACETAMINOPHEN 5-325 MG PO TABS: 1.0000 | ORAL_TABLET | ORAL | Status: DC | PRN

## 2020-10-09 MED ORDER — LABETALOL HCL 5 MG/ML IV SOLN
20.0000 mg | INTRAVENOUS | Status: DC | PRN
  Administered 2020-10-10: 13:00:00 20 mg via INTRAVENOUS
  Filled 2020-10-09: qty 4

## 2020-10-09 MED ORDER — ACETAMINOPHEN 325 MG PO TABS: 650.0000 mg | ORAL_TABLET | ORAL | Status: DC | PRN

## 2020-10-09 MED ORDER — TERBUTALINE SULFATE 1 MG/ML IJ SOLN: 0.2500 mg | Freq: Once | INTRAMUSCULAR | Status: DC | PRN

## 2020-10-09 MED ORDER — MISOPROSTOL 25 MCG QUARTER TABLET
ORAL_TABLET | ORAL | Status: AC
  Filled 2020-10-09: qty 1

## 2020-10-09 MED ORDER — ONDANSETRON HCL 4 MG/2ML IJ SOLN: 4.0000 mg | Freq: Four times a day (QID) | INTRAMUSCULAR | Status: DC | PRN

## 2020-10-09 MED ORDER — HYDRALAZINE HCL 10 MG PO TABS
10.0000 mg | ORAL_TABLET | Freq: Once | ORAL | Status: DC
  Filled 2020-10-09: qty 1

## 2020-10-09 MED ORDER — SOD CITRATE-CITRIC ACID 500-334 MG/5ML PO SOLN: 30.0000 mL | ORAL | Status: DC | PRN

## 2020-10-09 NOTE — H&P (Signed)
Jennifer Hebert is a 29 y.o. female presenting for induction of labor due to gestational HTN versus chronic HTN with SIPE. Seen in the office today with BP of 168/98.  No PIH sxs otherwise.  Has had mildly elevated BPs throughout the pregnancy with normal labs, and US fetal growth.  Last [place] 09/18/20 EFW 6+11# (87%tile). GBS - OB History    Gravida  1   Para      Term      Preterm      AB      Living        SAB      IAB      Ectopic      Multiple      Live Births             Past Medical History:  Diagnosis Date  . Anxiety    was on meds no longer - last use of anxiey meds in 06/2015   . Complication of anesthesia    needs to taken off slowly  . Family history of adverse reaction to anesthesia    mother slow to wake up after hysterectomy   . GERD (gastroesophageal reflux disease)   . Pregnancy induced hypertension    Past Surgical History:  Procedure Laterality Date  . ANKLE SURGERY Right   . CYSTOSCOPY W/ URETERAL STENT PLACEMENT Bilateral 04/22/2016   Procedure: CYSTOSCOPY WITH RETROGRADE PYELOGRAM/ RIGHT  URETERAL STENT PLACEMENT ;  Surgeon: Sebastian Ache, MD;  Location: [facility];  Service: Urology;  Laterality: Bilateral;  . CYSTOSCOPY W/ URETERAL STENT PLACEMENT Bilateral 04/24/2016   Procedure: CYSTOSCOPY WITH STENT REPLACEMENT;  Surgeon: Sebastian Ache, MD;  Location: [facility];  Service: Urology;  Laterality: Bilateral;  . CYSTOSCOPY WITH URETEROSCOPY Left 04/24/2016   Procedure: CYSTOSCOPY WITH URETEROSCOPY;  Surgeon: Sebastian Ache, MD;  Location: [facility];  Service: Urology;  Laterality: Left;  . HOLMIUM LASER APPLICATION Bilateral 04/24/2016   Procedure: HOLMIUM LASER APPLICATION;  Surgeon: Sebastian Ache, MD;  Location: [facility];  Service: Urology;  Laterality: Bilateral;  . NEPHROLITHOTOMY Right 04/22/2016   Procedure: 1ST STAGE NEPHROLITHOTOMY PERCUTANEOUS WITH SURGEON ACCESS;  Surgeon: Sebastian Ache, MD;  Location: [facility];  Service: Urology;  Laterality:  Right;  . NEPHROLITHOTOMY Right 04/24/2016   Procedure: 2ND STAGE NEPHROLITHOTOMY PERCUTANEOUS;  Surgeon: Sebastian Ache, MD;  Location: [facility];  Service: Urology;  Laterality: Right;  . precancerous skin cancers removed     . TONSILLECTOMY     Family History: family history includes Stroke in her maternal aunt, maternal grandmother, and mother. Social History:  reports that she quit smoking about 5 years ago. She has never used smokeless tobacco. She reports current alcohol use. She reports current drug use. Drug: Marijuana.     Maternal Diabetes: No Genetic Screening: Normal Maternal Ultrasounds/Referrals: Normal Fetal Ultrasounds or other Referrals:  None Maternal Substance Abuse:  No Significant Maternal Medications:  None Significant Maternal Lab Results:  Group B Strep negative Other Comments:  None  Review of Systems History   Height 5\' 5"  (1.651 m), weight 119.1 kg. Exam Physical Exam  Per Grewal Cx 2/30/-2 Prenatal labs: ABO, Rh:   Antibody:   Rubella:   RPR:    HBsAg:    HIV:    GBS: Negative/-- (11/24 0000)   Assessment/Plan: IUP at 38 4/7 CHTN with SIPE.  Rec IOL.  Will check labs and monitor/control BP.  If severe range, will use magnesium prophylaxis  Begin with cytotec cx ripening  Turner Daniels 10/09/2020, 5:55 PM

## 2020-10-10 ENCOUNTER — Inpatient Hospital Stay (HOSPITAL_COMMUNITY): Payer: 59 | Admitting: Anesthesiology

## 2020-10-10 ENCOUNTER — Encounter (HOSPITAL_COMMUNITY): Payer: Self-pay | Admitting: Obstetrics and Gynecology

## 2020-10-10 LAB — CBC WITH DIFFERENTIAL/PLATELET
Abs Immature Granulocytes: 0.05 10*3/uL (ref 0.00–0.07)
Basophils Absolute: 0 10*3/uL (ref 0.0–0.1)
Basophils Relative: 0 %
Eosinophils Absolute: 0 10*3/uL (ref 0.0–0.5)
Eosinophils Relative: 0 %
HCT: 34 % — ABNORMAL LOW (ref 36.0–46.0)
Hemoglobin: 11.9 g/dL — ABNORMAL LOW (ref 12.0–15.0)
Immature Granulocytes: 0 %
Lymphocytes Relative: 10 %
Lymphs Abs: 1.6 10*3/uL (ref 0.7–4.0)
MCH: 28.8 pg (ref 26.0–34.0)
MCHC: 35 g/dL (ref 30.0–36.0)
MCV: 82.3 fL (ref 80.0–100.0)
Monocytes Absolute: 0.7 10*3/uL (ref 0.1–1.0)
Monocytes Relative: 4 %
Neutro Abs: 14.5 10*3/uL — ABNORMAL HIGH (ref 1.7–7.7)
Neutrophils Relative %: 86 %
Platelets: 261 10*3/uL (ref 150–400)
RBC: 4.13 MIL/uL (ref 3.87–5.11)
RDW: 14.6 % (ref 11.5–15.5)
WBC: 16.9 10*3/uL — ABNORMAL HIGH (ref 4.0–10.5)
nRBC: 0 % (ref 0.0–0.2)

## 2020-10-10 LAB — CBC
HCT: 36.3 % (ref 36.0–46.0)
Hemoglobin: 12 g/dL (ref 12.0–15.0)
MCH: 27.9 pg (ref 26.0–34.0)
MCHC: 33.1 g/dL (ref 30.0–36.0)
MCV: 84.4 fL (ref 80.0–100.0)
Platelets: 217 10*3/uL (ref 150–400)
RBC: 4.3 MIL/uL (ref 3.87–5.11)
RDW: 14.6 % (ref 11.5–15.5)
WBC: 12 10*3/uL — ABNORMAL HIGH (ref 4.0–10.5)
nRBC: 0 % (ref 0.0–0.2)

## 2020-10-10 LAB — RPR: RPR Ser Ql: NONREACTIVE

## 2020-10-10 MED ORDER — OXYTOCIN-SODIUM CHLORIDE 30-0.9 UT/500ML-% IV SOLN
INTRAVENOUS | Status: AC
  Administered 2020-10-10: 08:00:00 2 m[IU]/min via INTRAVENOUS
  Filled 2020-10-10: qty 500

## 2020-10-10 MED ORDER — BISACODYL 10 MG RE SUPP: 10.0000 mg | Freq: Every day | RECTAL | Status: DC | PRN

## 2020-10-10 MED ORDER — DIPHENHYDRAMINE HCL 50 MG/ML IJ SOLN: 12.5000 mg | INTRAMUSCULAR | Status: DC | PRN

## 2020-10-10 MED ORDER — PHENYLEPHRINE 40 MCG/ML (10ML) SYRINGE FOR IV PUSH (FOR BLOOD PRESSURE SUPPORT): 80.0000 ug | PREFILLED_SYRINGE | INTRAVENOUS | Status: DC | PRN

## 2020-10-10 MED ORDER — FLEET ENEMA 7-19 GM/118ML RE ENEM: 1.0000 | ENEMA | Freq: Every day | RECTAL | Status: DC | PRN

## 2020-10-10 MED ORDER — COCONUT OIL OIL: 1.0000 "application " | TOPICAL_OIL | Status: DC | PRN

## 2020-10-10 MED ORDER — OXYCODONE HCL 5 MG PO TABS: 5.0000 mg | ORAL_TABLET | ORAL | Status: DC | PRN

## 2020-10-10 MED ORDER — ZOLPIDEM TARTRATE 5 MG PO TABS: 5.0000 mg | ORAL_TABLET | Freq: Every evening | ORAL | Status: DC | PRN

## 2020-10-10 MED ORDER — ACETAMINOPHEN 325 MG PO TABS: 650.0000 mg | ORAL_TABLET | ORAL | Status: DC | PRN

## 2020-10-10 MED ORDER — ONDANSETRON HCL 4 MG/2ML IJ SOLN: 4.0000 mg | INTRAMUSCULAR | Status: DC | PRN

## 2020-10-10 MED ORDER — DIPHENHYDRAMINE HCL 25 MG PO CAPS: 25.0000 mg | ORAL_CAPSULE | Freq: Four times a day (QID) | ORAL | Status: DC | PRN

## 2020-10-10 MED ORDER — LIDOCAINE HCL (PF) 1 % IJ SOLN
INTRAMUSCULAR | Status: DC | PRN
  Administered 2020-10-10: 10 mL via EPIDURAL

## 2020-10-10 MED ORDER — SIMETHICONE 80 MG PO CHEW: 80.0000 mg | CHEWABLE_TABLET | ORAL | Status: DC | PRN

## 2020-10-10 MED ORDER — OXYCODONE HCL 5 MG PO TABS: 10.0000 mg | ORAL_TABLET | ORAL | Status: DC | PRN

## 2020-10-10 MED ORDER — IBUPROFEN 600 MG PO TABS
600.0000 mg | ORAL_TABLET | Freq: Four times a day (QID) | ORAL | Status: DC
  Administered 2020-10-10 – 2020-10-13 (×11): 600 mg via ORAL
  Filled 2020-10-10 (×12): qty 1

## 2020-10-10 MED ORDER — OXYTOCIN-SODIUM CHLORIDE 30-0.9 UT/500ML-% IV SOLN: 1.0000 m[IU]/min | INTRAVENOUS | Status: DC

## 2020-10-10 MED ORDER — PRENATAL MULTIVITAMIN CH
1.0000 | ORAL_TABLET | Freq: Every day | ORAL | Status: DC
  Administered 2020-10-11 – 2020-10-13 (×3): 1 via ORAL
  Filled 2020-10-10 (×3): qty 1

## 2020-10-10 MED ORDER — DIBUCAINE (PERIANAL) 1 % EX OINT: 1.0000 "application " | TOPICAL_OINTMENT | CUTANEOUS | Status: DC | PRN

## 2020-10-10 MED ORDER — SODIUM CHLORIDE (PF) 0.9 % IJ SOLN
INTRAMUSCULAR | Status: DC | PRN
  Administered 2020-10-10: 12 mL/h via EPIDURAL

## 2020-10-10 MED ORDER — FENTANYL-BUPIVACAINE-NACL 0.5-0.125-0.9 MG/250ML-% EP SOLN
12.0000 mL/h | EPIDURAL | Status: DC | PRN
  Filled 2020-10-10: qty 250

## 2020-10-10 MED ORDER — WITCH HAZEL-GLYCERIN EX PADS: 1.0000 "application " | MEDICATED_PAD | CUTANEOUS | Status: DC | PRN

## 2020-10-10 MED ORDER — SENNOSIDES-DOCUSATE SODIUM 8.6-50 MG PO TABS
2.0000 | ORAL_TABLET | Freq: Every day | ORAL | Status: DC
  Administered 2020-10-11 – 2020-10-13 (×3): 2 via ORAL
  Filled 2020-10-10 (×3): qty 2

## 2020-10-10 MED ORDER — EPHEDRINE 5 MG/ML INJ: 10.0000 mg | INTRAVENOUS | Status: DC | PRN

## 2020-10-10 MED ORDER — TETANUS-DIPHTH-ACELL PERTUSSIS 5-2.5-18.5 LF-MCG/0.5 IM SUSY: 0.5000 mL | PREFILLED_SYRINGE | Freq: Once | INTRAMUSCULAR | Status: DC

## 2020-10-10 MED ORDER — MEASLES, MUMPS & RUBELLA VAC IJ SOLR: 0.5000 mL | Freq: Once | INTRAMUSCULAR | Status: DC

## 2020-10-10 MED ORDER — BENZOCAINE-MENTHOL 20-0.5 % EX AERO
1.0000 "application " | INHALATION_SPRAY | CUTANEOUS | Status: DC | PRN
  Administered 2020-10-10: 1 via TOPICAL
  Filled 2020-10-10: qty 56

## 2020-10-10 MED ORDER — ONDANSETRON HCL 4 MG PO TABS: 4.0000 mg | ORAL_TABLET | ORAL | Status: DC | PRN

## 2020-10-10 MED ORDER — LACTATED RINGERS IV SOLN
500.0000 mL | Freq: Once | INTRAVENOUS | Status: AC
  Administered 2020-10-10: 05:00:00 500 mL via INTRAVENOUS

## 2020-10-10 MED ORDER — MEDROXYPROGESTERONE ACETATE 150 MG/ML IM SUSP: 150.0000 mg | INTRAMUSCULAR | Status: DC | PRN

## 2020-10-10 NOTE — Anesthesia Preprocedure Evaluation (Signed)
Anesthesia Evaluation  Patient identified by MRN, date of birth, ID band Patient awake    Reviewed: Allergy & Precautions, H&P , NPO status , Patient's Chart, lab work & pertinent test results  Airway Mallampati: II  TM Distance: >3 FB Neck ROM: Full    Dental no notable dental hx.    Pulmonary neg pulmonary ROS, former smoker,    Pulmonary exam normal breath sounds clear to auscultation       Cardiovascular hypertension, negative cardio ROS Normal cardiovascular exam Rhythm:Regular Rate:Normal     Neuro/Psych PSYCHIATRIC DISORDERS Anxiety negative neurological ROS     GI/Hepatic Neg liver ROS, GERD  ,  Endo/Other  Morbid obesity  Renal/GU Renal disease (kidney stones)  negative genitourinary   Musculoskeletal negative musculoskeletal ROS (+)   Abdominal   Peds negative pediatric ROS (+)  Hematology negative hematology ROS (+)   Anesthesia Other Findings   Reproductive/Obstetrics negative OB ROS                             Anesthesia Physical Anesthesia Plan  ASA: III  Anesthesia Plan: Epidural   Post-op Pain Management:    Induction: Intravenous  PONV Risk Score and Plan: 2  Airway Management Planned:   Additional Equipment:   Intra-op Plan:   Post-operative Plan:   Informed Consent: I have reviewed the patients History and Physical, chart, labs and discussed the procedure including the risks, benefits and alternatives for the proposed anesthesia with the patient or authorized representative who has indicated his/her understanding and acceptance.       Plan Discussed with: Anesthesiologist  Anesthesia Plan Comments:         Anesthesia Quick Evaluation

## 2020-10-10 NOTE — Anesthesia Procedure Notes (Signed)
Epidural Patient location during procedure: OB Start time: 10/10/2020 4:50 AM End time: 10/10/2020 5:00 AM  Staffing Anesthesiologist: Mellody Dance, MD Performed: anesthesiologist   Preanesthetic Checklist Completed: patient identified, IV checked, site marked, risks and benefits discussed, monitors and equipment checked, pre-op evaluation and timeout performed  Epidural Patient position: sitting Prep: DuraPrep Patient monitoring: heart rate, cardiac monitor, continuous pulse ox and blood pressure Approach: midline Location: L3-L4 Injection technique: LOR saline  Needle:  Needle type: Tuohy  Needle gauge: 17 G Needle length: 9 cm Needle insertion depth: 8 cm Catheter type: closed end flexible Catheter size: 20 Guage Catheter at skin depth: 13 cm Test dose: negative and Other  Assessment Events: blood not aspirated, injection not painful, no injection resistance and negative IV test  Additional Notes Informed consent obtained prior to proceeding including risk of failure, 1% risk of PDPH, risk of minor discomfort and bruising.  Discussed rare but serious complications including epidural abscess, permanent nerve injury, epidural hematoma.  Discussed alternatives to epidural analgesia and patient desires to proceed.  Timeout performed pre-procedure verifying patient name, procedure, and platelet count.  Patient tolerated procedure well. 1 attempt with easy placement and catheter threaded easily. -heme -CSF.

## 2020-10-10 NOTE — Lactation Note (Signed)
This note was copied from a baby's chart. Lactation Consultation Note  Patient Name: Jennifer Hebert [id number] Date: 10/10/2020  [name] Kirchman 0 hours old. Saw in labor and delivery. Induced due to moms high blood pressure.  Mom vaginal delivery with epidural. Mom is a g1p1.  Mom reports took a breastfeeding class on line.  RN assisting with breastfeed on arrival.  Infant hasn't latched yet. RN laid mom back to check her and then [facility] put her up to about 45 degrees when RN finished. .  [name] latched in laid back breastfeeding.  Infant chompy and bitty at first, but then feeding improved and mom reported comfort.  Some rhythmic sucking. Few swallows noted. Mom reports she knows how to hand express and sees colostrum with hand expression.    Asked dad to take over.  Left parents breastfeeding their beautiful [name].  Urged to call lactation as needed.  Let mom know we would follow up with her later today.     Maternal Data    Feeding    LATCH Score                   Interventions    Lactation Tools Discussed/Used     Consult Status      Katerra Ingman Michaelle Copas 10/10/2020, 1:16 PM

## 2020-10-10 NOTE — Progress Notes (Signed)
Patient is comfortable with epidural.  FHR category 1 Cervix is 80% 5 to 6 -1 arom  Clear fluid Vertex BP (!) 146/92   Pulse 87   Temp 98.3 F (36.8 C) (Axillary)   Resp 15   Ht 5\' 5"  (1.651 m)   Wt 119.1 kg   SpO2 97%   BMI 43.68 kg/m  Results for orders placed or performed during the hospital encounter of 10/09/20 (from the past 24 hour(s))  CBC     Status: None   Collection Time: 10/09/20  6:40 PM  Result Value Ref Range   WBC 9.7 4.0 - 10.5 K/uL   RBC 4.36 3.87 - 5.11 MIL/uL   Hemoglobin 12.5 12.0 - 15.0 g/dL   HCT 10/11/20 10.2 - 58.5 %   MCV 83.5 80.0 - 100.0 fL   MCH 28.7 26.0 - 34.0 pg   MCHC 34.3 30.0 - 36.0 g/dL   RDW 27.7 82.4 - 23.5 %   Platelets 269 150 - 400 K/uL   nRBC 0.0 0.0 - 0.2 %  Type and screen  [facility]     Status: None   Collection Time: 10/09/20  6:40 PM  Result Value Ref Range   ABO/RH(D) O POS    Antibody Screen NEG    Sample Expiration      10/12/2020,2359 Performed at [facility], [address] [place], [place]   Comprehensive metabolic panel     Status: Abnormal   Collection Time: 10/09/20  6:40 PM  Result Value Ref Range   Sodium 136 135 - 145 mmol/L   Potassium 3.6 3.5 - 5.1 mmol/L   Chloride 105 98 - 111 mmol/L   CO2 20 (L) 22 - 32 mmol/L   Glucose, Bld 71 70 - 99 mg/dL   BUN 8 6 - 20 mg/dL   Creatinine, Ser 10/11/20 0.44 - 1.00 mg/dL   Calcium 9.1 8.9 - 4.00 mg/dL   Total Protein 6.6 6.5 - 8.1 g/dL   Albumin 3.2 (L) 3.5 - 5.0 g/dL   AST 26 15 - 41 U/L   ALT 21 0 - 44 U/L   Alkaline Phosphatase 136 (H) 38 - 126 U/L   Total Bilirubin 0.6 0.3 - 1.2 mg/dL   GFR, Estimated 86.7 >61 mL/min   Anion gap 11 5 - 15  Resp Panel by RT-PCR (Flu A&B, Covid) Nasopharyngeal Swab     Status: None   Collection Time: 10/09/20  6:40 PM   Specimen: Nasopharyngeal Swab; Nasopharyngeal(NP) swabs in vial transport medium  Result Value Ref Range   SARS Coronavirus 2 by RT PCR NEGATIVE NEGATIVE   Influenza A by PCR  NEGATIVE NEGATIVE   Influenza B by PCR NEGATIVE NEGATIVE  CBC     Status: Abnormal   Collection Time: 10/10/20  3:34 AM  Result Value Ref Range   WBC 12.0 (H) 4.0 - 10.5 K/uL   RBC 4.30 3.87 - 5.11 MIL/uL   Hemoglobin 12.0 12.0 - 15.0 g/dL   HCT 10/12/20 09.3 - 26.7 %   MCV 84.4 80.0 - 100.0 fL   MCH 27.9 26.0 - 34.0 pg   MCHC 33.1 30.0 - 36.0 g/dL   RDW 12.4 58.0 - 99.8 %   Platelets 217 150 - 400 K/uL   nRBC 0.0 0.0 - 0.2 %   Continue pitocin and follow labor curve Anticipate NSVD

## 2020-10-10 NOTE — Lactation Note (Signed)
This note was copied from a baby's chart. Lactation Consultation Note  Patient Name: Jennifer Hebert JASNK'N Date: 10/10/2020 Reason for consult: Follow-up assessment;Mother's request;Difficult latch;Early term 37-38.6wks P1, 7 hour ETI female infant. Per mom, she has DEBP at home. Per mom, infant latched briefly in L&D and she made attempt earlier but infant did not latch,  this be her third time attempting to  latch infant at the breast. Mom attempted to latch infant on her right breast using the football hold position, infant suckled briefly 3 seconds then stopped , mostly held breast in mouth. LC notice infant has recessive chin and sucks in bottle lip, LC  gently extended infant's  lower jaw and flanged top lip out to help with latch.  Mom will continue to work towards latching infant at the breast, and knows to call RN or [facility] for assistance with latch if needed. Mom was taught hand expression and infant was given 8 mls of colostrum by spoon. Per mom, she leaked last few weeks of her pregnancy.  Mom knows to BF infant according to cues, 8 to 12+ times within 24 hours, STS. LC discussed infant's  Input and output. LC suggested mom attend [place] [facility] ( free) after discharge from hospital within the local community.  Mom made aware of O/P services, breastfeeding support groups, community resources, and our phone # for post-discharge questions.   Maternal Data Formula Feeding for Exclusion: No Has patient been taught Hand Expression?: Yes Does the patient have breastfeeding experience prior to this delivery?: No  Feeding Feeding Type: Breast Fed  LATCH Score Latch: Too sleepy or reluctant, no latch achieved, no sucking elicited.  Audible Swallowing: None  Type of Nipple: Everted at rest and after stimulation  Comfort (Breast/Nipple): Soft / non-tender  Hold (Positioning): Assistance needed to correctly position infant at breast and maintain  latch.  LATCH Score: 5  Interventions Interventions: Breast feeding basics reviewed;Assisted with latch;Skin to skin;Breast massage;Hand express;Expressed milk;Position options;Support pillows;Adjust position;Breast compression  Lactation Tools Discussed/Used WIC Program: No   Consult Status Consult Status: Follow-up Date: 10/11/20 Follow-up type: In-patient    Danelle Earthly 10/10/2020, 7:22 PM

## 2020-10-11 LAB — CBC
HCT: 29.8 % — ABNORMAL LOW (ref 36.0–46.0)
Hemoglobin: 9.9 g/dL — ABNORMAL LOW (ref 12.0–15.0)
MCH: 28 pg (ref 26.0–34.0)
MCHC: 33.2 g/dL (ref 30.0–36.0)
MCV: 84.4 fL (ref 80.0–100.0)
Platelets: 183 10*3/uL (ref 150–400)
RBC: 3.53 MIL/uL — ABNORMAL LOW (ref 3.87–5.11)
RDW: 14.9 % (ref 11.5–15.5)
WBC: 11.6 10*3/uL — ABNORMAL HIGH (ref 4.0–10.5)
nRBC: 0 % (ref 0.0–0.2)

## 2020-10-11 NOTE — Lactation Note (Signed)
This note was copied from a baby's chart. Lactation Consultation Note  Patient Name: Jennifer Hebert RUEAV'W Date: 10/11/2020 Reason for consult: Follow-up assessment  P1 mother whose infant is now 53 hours old.  This is an ETI at 38+5 weeks.  Baby was asleep at mother's side when I arrived.  RN, Destiny, offered to assist with latching and mother agreeable.  Mother was interested in using the cross cradle hold.  Mother demonstrated hand expression and was able to express a few colostrum drops which she put in baby's mouth.  RN attempted to latch, however, baby was not interested and pushed back off the breast.  A few more unsuccessful attempts tried.  Suggested mother place baby STS on her chest where he calmly lay before falling asleep.  Breast feeding basics reviewed.  Mother will continue to feed on cue and will call for latch assistance as needed.  Baby had his circumcision this morning so I informed mother that he may be interested in cluster feeding tonight.    Mother has a DEBP for home use.  No support person present at this time.  RN in room at end of visit and updated.   Maternal Data    Feeding Feeding Type: Breast Fed  LATCH Score Latch: Too sleepy or reluctant, no latch achieved, no sucking elicited.  Audible Swallowing: None  Type of Nipple: Everted at rest and after stimulation  Comfort (Breast/Nipple): Soft / non-tender  Hold (Positioning): Assistance needed to correctly position infant at breast and maintain latch.  LATCH Score: 5  Interventions Interventions: Breast feeding basics reviewed;Assisted with latch;Skin to skin;Hand express;Position options;Support pillows;Adjust position  Lactation Tools Discussed/Used     Consult Status Consult Status: Follow-up Date: 10/12/20 Follow-up type: In-patient    Addeline Calarco R Brecklynn Jian 10/11/2020, 5:30 PM

## 2020-10-11 NOTE — Progress Notes (Signed)
D/W circumcision of female baby Risks reviewed She states she understands and agrees

## 2020-10-11 NOTE — Social Work (Signed)
CSW received consult for hx of Anxiety.  CSW met with MOB to offer support and complete assessment.     CSW introduced self and role. CSW observed MOB in room alone. MOB reported baby Dacre was in procedure for circumcision. MOB was open to speaking with CSW and pleasant throughout assessment. CSW informed MOB of reason for consult. MOB reported she was diagnosed with anxiety in 2015 while in college. MOB disclosed she was on Sertraline to treat from 2015 to 2016. MOB attributed the anxiety to testing in college and stated she would have trouble speaking. MOB stated she has never experienced any other anxiety. MOB stated she has never been to therapy and stated she has a strong support system consisting of family and friends. MOB reported she had a good and easy pregnancy. MOB stated she is currently doing good and denies any SI, HI or being involved in DV. CSW provided education regarding the baby blues period vs. perinatal mood disorders, discussed treatment and gave resources for mental health follow up if concerns arise.  CSW recommends self-evaluation during the postpartum time period using the New Mom Checklist from Postpartum Progress and encouraged MOB to contact a medical professional if symptoms are noted at any time.   CSW provided review of Sudden Infant Death Syndrome (SIDS) precautions. MOB reported baby will sleep in a bassinet. MOB identified Dr. Smith with [facility] for follow-up care. MOB denies any transportation barriers. MOB stated she has everything needed for baby, including a new car seat. MOB expressed no additional needs at this time.   CSW identifies no further need for intervention and no barriers to discharge at this time.  Liani Caris, LCSWA Clinical Social Work [facility] [phone] 

## 2020-10-11 NOTE — Progress Notes (Signed)
Post Partum Day 1 Subjective: no complaints, up ad lib, voiding, tolerating PO and + flatus No headache, no vision changes Objective: Blood pressure (!) 143/85, pulse 80, temperature 98 F (36.7 C), temperature source Oral, resp. rate 16, height 5\' 5"  (1.651 m), weight 119.1 kg, SpO2 97 %, unknown if currently breastfeeding.  Physical Exam:  General: alert, cooperative and no distress Lochia: appropriate Uterine Fundus: firm Incision: healing well DVT Evaluation: No evidence of DVT seen on physical exam.  Recent Labs    10/10/20 1408 10/11/20 0459  HGB 11.9* 9.9*  HCT 34.0* 29.8*    Assessment/Plan: Plan for discharge tomorrow BP trending down-observe  LOS: 2 days   10/13/20 II 10/11/2020, 7:04 AM

## 2020-10-11 NOTE — Anesthesia Postprocedure Evaluation (Signed)
Anesthesia Post Note  Patient: Jennifer Hebert  Procedure(s) Performed: AN AD HOC LABOR EPIDURAL     Patient location during evaluation: Mother Baby Anesthesia Type: Epidural Level of consciousness: awake and alert Pain management: pain level controlled Vital Signs Assessment: post-procedure vital signs reviewed and stable Respiratory status: spontaneous breathing, nonlabored ventilation and respiratory function stable Cardiovascular status: stable Postop Assessment: no headache, no backache and epidural receding Anesthetic complications: no   No complications documented.  Last Vitals:  Vitals:   10/10/20 2312 10/11/20 0308  BP: 109/60 (!) 143/85  Pulse: 87 80  Resp: 18 16  Temp: 36.8 C 36.7 C  SpO2: 97% 97%    Last Pain:  Vitals:   10/11/20 0308  TempSrc: Oral  PainSc:    Pain Goal:                   Rica Records

## 2020-10-12 LAB — COMPREHENSIVE METABOLIC PANEL
ALT: 18 U/L (ref 0–44)
AST: 18 U/L (ref 15–41)
Albumin: 2.6 g/dL — ABNORMAL LOW (ref 3.5–5.0)
Alkaline Phosphatase: 89 U/L (ref 38–126)
Anion gap: 11 (ref 5–15)
BUN: 10 mg/dL (ref 6–20)
CO2: 21 mmol/L — ABNORMAL LOW (ref 22–32)
Calcium: 8.4 mg/dL — ABNORMAL LOW (ref 8.9–10.3)
Chloride: 106 mmol/L (ref 98–111)
Creatinine, Ser: 0.66 mg/dL (ref 0.44–1.00)
GFR, Estimated: >60 mL/min (ref >60)
Glucose, Bld: 82 mg/dL (ref 70–99)
Potassium: 4.1 mmol/L (ref 3.5–5.1)
Sodium: 138 mmol/L (ref 135–145)
Total Bilirubin: 0.5 mg/dL (ref 0.3–1.2)
Total Protein: 5.4 g/dL — ABNORMAL LOW (ref 6.5–8.1)

## 2020-10-12 LAB — CBC
HCT: 31.4 % — ABNORMAL LOW (ref 36.0–46.0)
Hemoglobin: 10.7 g/dL — ABNORMAL LOW (ref 12.0–15.0)
MCH: 28.6 pg (ref 26.0–34.0)
MCHC: 34.1 g/dL (ref 30.0–36.0)
MCV: 84 fL (ref 80.0–100.0)
Platelets: 229 10*3/uL (ref 150–400)
RBC: 3.74 MIL/uL — ABNORMAL LOW (ref 3.87–5.11)
RDW: 14.9 % (ref 11.5–15.5)
WBC: 9.4 10*3/uL (ref 4.0–10.5)
nRBC: 0 % (ref 0.0–0.2)

## 2020-10-12 MED ORDER — LABETALOL HCL 200 MG PO TABS
200.0000 mg | ORAL_TABLET | Freq: Two times a day (BID) | ORAL | Status: DC
  Administered 2020-10-12 – 2020-10-13 (×3): 200 mg via ORAL
  Filled 2020-10-12 (×3): qty 1

## 2020-10-12 MED ORDER — IBUPROFEN 600 MG PO TABS: 600.0000 mg | ORAL_TABLET | Freq: Four times a day (QID) | ORAL | 0 refills | Status: DC | PRN

## 2020-10-12 MED ORDER — LABETALOL HCL 200 MG PO TABS: 200.0000 mg | ORAL_TABLET | Freq: Two times a day (BID) | ORAL | 1 refills | Status: DC

## 2020-10-12 NOTE — Lactation Note (Signed)
This note was copied from a baby's chart. Lactation Consultation Note  Patient Name: Jennifer Hebert AJLUN'G Date: 10/12/2020  Mom reports she would like to start donor milk.  LC got donor milk consent signed.  Discussed options for giving donor milk.  Urged 5 french and syringe at breast. Discussed at breast with 5 french and syringe or with curved tip syringe.  Infant sleeping at this time and recently fed.  Urged parents to call when he starts cuing to assist with feeding supplement   Feeding Feeding Type: Breast Fed  [facility] Score                   Interventions    Lactation Tools Discussed/Used     Consult Status      Neomia Dear 10/12/2020, 11:55 AM

## 2020-10-12 NOTE — Lactation Note (Signed)
This note was copied from a baby's chart. Lactation Consultation Note LC attempted to see mom. Mom sleeping.  Patient Name: Jennifer Hebert Date: 10/12/2020     Maternal Data    Feeding Feeding Type: Breast Fed  LATCH Score                   Interventions    Lactation Tools Discussed/Used     Consult Status      Charyl Dancer 10/12/2020, 2:47 AM

## 2020-10-12 NOTE — Progress Notes (Signed)
Post Partum Day 2 Subjective: no complaints, up ad lib, voiding, tolerating PO and + flatus  History of elevated BP with anxiety. Treated in past with antihypertensives and then with anti anxiety medication. No HA, no vision change Objective: Blood pressure (!) 148/94, pulse 91, temperature (!) 97.4 F (36.3 C), temperature source Oral, resp. rate 18, height 5\' 5"  (1.651 m), weight 119.1 kg, SpO2 99 %, unknown if currently breastfeeding.  Physical Exam:  General: alert, cooperative and no distress Lochia: appropriate Uterine Fundus: firm Incision: healing well DVT Evaluation: No evidence of DVT seen on physical exam.  Recent Labs    10/11/20 0459 10/12/20 0638  HGB 9.9* 10.7*  HCT 29.8* 31.4*   Results for orders placed or performed during the hospital encounter of 10/09/20 (from the past 24 hour(s))  CBC     Status: Abnormal   Collection Time: 10/12/20  6:38 AM  Result Value Ref Range   WBC 9.4 4.0 - 10.5 K/uL   RBC 3.74 (L) 3.87 - 5.11 MIL/uL   Hemoglobin 10.7 (L) 12.0 - 15.0 g/dL   HCT 10/14/20 (L) 98.3 - 38.2 %   MCV 84.0 80.0 - 100.0 fL   MCH 28.6 26.0 - 34.0 pg   MCHC 34.1 30.0 - 36.0 g/dL   RDW 50.5 39.7 - 67.3 %   Platelets 229 150 - 400 K/uL   nRBC 0.0 0.0 - 0.2 %  Comprehensive metabolic panel     Status: Abnormal   Collection Time: 10/12/20  6:38 AM  Result Value Ref Range   Sodium 138 135 - 145 mmol/L   Potassium 4.1 3.5 - 5.1 mmol/L   Chloride 106 98 - 111 mmol/L   CO2 21 (L) 22 - 32 mmol/L   Glucose, Bld 82 70 - 99 mg/dL   BUN 10 6 - 20 mg/dL   Creatinine, Ser 10/14/20 0.44 - 1.00 mg/dL   Calcium 8.4 (L) 8.9 - 10.3 mg/dL   Total Protein 5.4 (L) 6.5 - 8.1 g/dL   Albumin 2.6 (L) 3.5 - 5.0 g/dL   AST 18 15 - 41 U/L   ALT 18 0 - 44 U/L   Alkaline Phosphatase 89 38 - 126 U/L   Total Bilirubin 0.5 0.3 - 1.2 mg/dL   GFR, Estimated 3.79 >02 mL/min   Anion gap 11 5 - 15   Assessment/Plan: Discharge home She wants to go home if possible Will start labetalol  200mg  po BID If BP OK will discharge after lunch and FU next week in office for BP check Discharge instructions reviewed  LOS: 3 days   >40 II 10/12/2020, 7:58 AM

## 2020-10-12 NOTE — Lactation Note (Addendum)
This note was copied from a baby's chart. Lactation Consultation Note  Patient Name: Jennifer Hebert TOIZT'I Date: 10/12/2020  Entered room and infant in crib sleeping. [name] Jennifer Hebert with 6 percent weight loss and increase in bili.  Mom reports she feels breastfeeding is going okay but he isn't really feeding well.  Mom reports she feels like she is doing something wrong.  Mom started to cry.  Dad has gone home to take care of there farm right now.  Explained to mom he was early term and they were induced and we will just go with him where he is.   Initiated pumping  using a DEBP with mom.  Stayed with mom duration of pumping.  Mom got about 4 ml from the left and nothing yet from the right.  Jennifer Hebert started cuing.  Asked mom if could assist with breastfeeding.  Mom agreed.  Laid her back slightly and put him tummy to mommy and he latched easily.  Came on and off a few times at first then settled into some rhythmic sucking and a few swallows seen.  Mom reports comfort.  Urged her to really focus on getting his chin into the breast. Urged mom to pump past breastfeedings (at least every 3 hours) and start feeding him back what she expresses.  Did not discuss donor milk as an option at this time. He fed a good 15 minutes prior to LC leaving.  Mom reported that she has thought about doing some donor milk.   Left mom and Jennifer Hebert breastfeeding.  Urged to call lactation as needed.   Maternal Data    Feeding Feeding Type: Breast Fed  [facility] Score                   Interventions    Lactation Tools Discussed/Used     Consult Status      Kare Dado Michaelle Copas 10/12/2020, 9:59 AM

## 2020-10-12 NOTE — Lactation Note (Signed)
This note was copied from a baby's chart. Lactation Consultation Note  Patient Name: Jennifer Hebert [id number] Date: 10/12/2020 Reason for consult: Follow-up assessment  Follow up to 51 hours old infant with 5.52% of weight loss at the time of visit. Mother is requesting assistance to breastfeed infant with donor milk supplementation via 5"french tube and syringe. Infant able to take 17 mL of DM at breast with 5"french tube and syringe. Breastfeeding lasted ~43minutes. Noted a deep latch with good suckling and swallowing (no need to use syringe plunger). Mother burped infant.  Mother has DEBP set up and encouraged using to stimulate her breasts after each supplementation.  Promoted maternal rest, hydration and food intake. Encouraged to contact [facility] for support when ready to breastfeed baby and recommended to request help for questions or concerns.    All questions answered at this time.   Maternal Data Has patient been taught Hand Expression?: Yes  Feeding Feeding Type: Breast Milk with Donor Milk  LATCH Score Latch: Grasps breast easily, tongue down, lips flanged, rhythmical sucking.  Audible Swallowing: Spontaneous and intermittent  Type of Nipple: Everted at rest and after stimulation  Comfort (Breast/Nipple): Soft / non-tender  Hold (Positioning): Assistance needed to correctly position infant at breast and maintain latch.  LATCH Score: 9  Interventions Interventions: Assisted with latch;Skin to skin;Hand express;Adjust position;Support pillows;Expressed milk;DEBP  Lactation Tools Discussed/Used Tools: Pump;Flanges;49F feeding tube / Syringe Flange Size: 24 Breast pump type: Double-Electric Breast Pump   Consult Status Consult Status: Follow-up Date: 10/13/20 Follow-up type: In-patient    [name] 10/12/2020, 3:31 PM

## 2020-10-13 NOTE — Discharge Summary (Signed)
Postpartum Discharge Summary  Date of Service updated 10/13/20     Patient Name: Jennifer Hebert DOB: 1991/09/12 MRN: [id number]  Date of admission: 10/09/2020 Delivery date:10/10/2020  Delivering provider: Dian Queen  Date of discharge: 10/13/2020  Admitting diagnosis: Pregnancy [Z34.90] NSVD (normal spontaneous vaginal delivery) [O80]  Gestational hypertension Intrauterine pregnancy: 101w5d    Secondary diagnosis:  Active Problems:   Pregnancy   NSVD (normal spontaneous vaginal delivery)  Additional problems: gestational hypertension    Discharge diagnosis: Term Pregnancy Delivered and Gestational Hypertension                                              Post partum procedures: Augmentation: AROM, Pitocin and Cytotec Complications: None  Hospital course: Induction of Labor With Vaginal Delivery   29y.o. yo G1P1001 at 392w5das admitted to the hospital 10/09/2020 for induction of labor.  Indication for induction: Gestational hypertension.  Patient had an uncomplicated labor course as follows: Membrane Rupture Time/Date: 8:16 AM ,10/10/2020   Delivery Method:Vaginal, Spontaneous  Episiotomy: None  Lacerations:  2nd degree;Perineal  Details of delivery can be found in separate delivery note.  Patient had a routine postpartum course. Patient is discharged home 10/13/20.  Newborn Data: Birth date:10/10/2020  Birth time:12:17 PM  Gender:Female  Living status:Living  Apgars:9 ,9  Weight:3530 g   Magnesium Sulfate received: No BMZ received: No Rhophylac:No MMR:No T-DaP:Given prenatally Flu: No Transfusion:No  Physical exam  Vitals:   10/12/20 1147 10/12/20 1641 10/12/20 2002 10/13/20 0454  BP: 132/77 138/82 134/69 (!) 139/91  Pulse:  82 76 96  Resp:  _0 Temp:  98.9 F (37.2 C) 98.4 F (36.9 C) 98.9 F (37.2 C)  TempSrc:  Oral Oral Oral  SpO2:   100% 97%  Weight:      Height:       General: alert, cooperative and no distress Lochia:  appropriate Uterine Fundus: firm Incision: No significant erythema DVT Evaluation: No evidence of DVT seen on physical exam. Labs: Lab Results  Component Value Date   WBC 9.4 10/12/2020   HGB 10.7 (L) 10/12/2020   HCT 31.4 (L) 10/12/2020   MCV 84.0 10/12/2020   PLT 229 10/12/2020   CMP Latest Ref Rng & Units 10/12/2020  Glucose 70 - 99 mg/dL 82  BUN 6 - 20 mg/dL 10  Creatinine 0.44 - 1.00 mg/dL 0.66  Sodium 135 - 145 mmol/L 138  Potassium 3.5 - 5.1 mmol/L 4.1  Chloride 98 - 111 mmol/L 106  CO2 22 - 32 mmol/L 21(L)  Calcium 8.9 - 10.3 mg/dL 8.4(L)  Total Protein 6.5 - 8.1 g/dL 5.4(L)  Total Bilirubin 0.3 - 1.2 mg/dL 0.5  Alkaline Phos 38 - 126 U/L 89  AST 15 - 41 U/L 18  ALT 0 - 44 U/L 18   Edinburgh Score: Edinburgh Postnatal Depression Scale Screening Tool 10/12/2020  I have been able to laugh and see the funny side of things. 0  I have looked forward with enjoyment to things. 0  I have blamed myself unnecessarily when things went wrong. 1  I have been anxious or worried for no good reason. 0  I have felt scared or panicky for no good reason. 0  Things have been getting on top of me. 1  I have been so unhappy that I have had difficulty sleeping.  0  I have felt sad or miserable. 0  I have been so unhappy that I have been crying. 0  The thought of harming myself has occurred to me. 0  Edinburgh Postnatal Depression Scale Total 2      After visit meds:  Allergies as of 10/13/2020   No Known Allergies     Medication List    STOP taking these medications   aspirin 81 MG chewable tablet   cephALEXin 500 MG capsule Commonly known as: Keflex   oxyCODONE-acetaminophen 5-325 MG tablet Commonly known as: Roxicet     TAKE these medications   acetaminophen 325 MG tablet Commonly known as: TYLENOL Take 650 mg by mouth every 6 (six) hours as needed for moderate pain or headache.   ibuprofen 600 MG tablet Commonly known as: ADVIL Take 1 tablet (600 mg total) by  mouth every 6 (six) hours as needed.   labetalol 200 MG tablet Commonly known as: NORMODYNE Take 1 tablet (200 mg total) by mouth 2 (two) times daily.   senna-docusate 8.6-50 MG tablet Commonly known as: Senokot-S Take 1 tablet by mouth 2 (two) times daily. While taking pain meds to prevent constipation        Discharge home in stable condition Infant Feeding: Breast Infant Disposition:home with mother Discharge instruction: per After Visit Summary and Postpartum booklet. Activity: Advance as tolerated. Pelvic rest for 6 weeks.  Diet: routine diet Anticipated Birth Control: Unsure Postpartum Appointment:6 weeks Additional Postpartum F/U: BP check 1 week Future Appointments:No future appointments. Follow up Visit:      10/13/2020 Allena Katz, MD

## 2020-10-13 NOTE — Lactation Note (Signed)
This note was copied from a baby's chart. Lactation Consultation Note  Patient Name: Jennifer Hebert EHOZY'Y Date: 10/13/2020   [name] Jennifer Hebert now under bili lights at 31 hours old.  However mom reports feeding is going much better.  Mom is still breastfeeding first and feeding back all expressed milk past breastfeedings .Praised efforts urged mom to keep working at it. Urged to call lactation as needed.  Maternal Data    Feeding Feeding Type: Breast Fed  LATCH Score                   Interventions    Lactation Tools Discussed/Used     Consult Status      Jennifer Hebert 10/13/2020, 1:19 PM

## 2020-10-14 ENCOUNTER — Ambulatory Visit: Payer: Self-pay

## 2020-10-14 ENCOUNTER — Other Ambulatory Visit (HOSPITAL_COMMUNITY): Payer: 59

## 2020-10-14 NOTE — Lactation Note (Signed)
This note was copied from a baby's chart. Lactation Consultation Note  Patient Name: Jennifer Hebert [id number] Date: 10/14/2020 Reason for consult: Follow-up assessment  Infant is 90+ hrs old. Mom pumped 23 mL recently; 25 ml last night; and 15 mL yesterday. Mom is encouraged by seeing an increase in the volume pumped. Mom knows to go to the maintenance phase if she gets more than 20 mL again.  Mom's nipples are intact, but I noted nipple discoloration on both nipples, which Mom states has been there since pregnancy.   Infant recently fed at the breast. When the FOB took the pacifier out of the infant's mouth, it was apparent that infant was still hungry. Mom gave me permission to give infant a bottle of Mom's EBM & DBM. Mom had 2 bottles from home: a Dr. Theora Gianotti Level 1 & Mam Level 0. Infant did better with the Mam bottle.   I encouraged Mom to palpate breasts prior to feeding and then after to assess milk transfer at breast.  Plan: 1. Mom to offer breast, then pump & give EBM via bottle. Parents will feed infant until content. Mom is fine with giving formula, if she doesn't have enough EBM to do so.   Mom has a Luna Motif pump at home. She has been using size 24 flanges with our Medela DEBP, but feels that they are beginning to rub on 1 side of the nipple. Mom will try the size 27 flange next. She was comfortable when she pumped with her Luna using the size 28 flange previously.   Mom knows how to reach [place] for any post-discharge questions.   Lurline Hare [facility] 10/14/2020, 10:43 AM

## 2020-10-15 ENCOUNTER — Inpatient Hospital Stay (HOSPITAL_COMMUNITY): Admission: AD | Admit: 2020-10-15 | Payer: 59 | Source: Home / Self Care | Admitting: Obstetrics and Gynecology

## 2020-10-15 ENCOUNTER — Encounter (HOSPITAL_COMMUNITY): Payer: Self-pay | Admitting: Obstetrics and Gynecology

## 2020-10-15 ENCOUNTER — Inpatient Hospital Stay (HOSPITAL_COMMUNITY): Payer: 59

## 2021-05-02 LAB — OB RESULTS CONSOLE RUBELLA ANTIBODY, IGM: Rubella: IMMUNE

## 2021-05-02 LAB — HEPATITIS C ANTIBODY: HCV Ab: NEGATIVE

## 2021-05-02 LAB — OB RESULTS CONSOLE RPR: RPR: NONREACTIVE

## 2021-05-02 LAB — OB RESULTS CONSOLE ABO/RH: RH Type: POSITIVE

## 2021-05-02 LAB — OB RESULTS CONSOLE HEPATITIS B SURFACE ANTIGEN: Hepatitis B Surface Ag: NEGATIVE

## 2021-05-02 LAB — OB RESULTS CONSOLE ANTIBODY SCREEN: Antibody Screen: NEGATIVE

## 2021-05-02 LAB — OB RESULTS CONSOLE GC/CHLAMYDIA
Chlamydia: NEGATIVE
Gonorrhea: NEGATIVE

## 2021-05-02 LAB — OB RESULTS CONSOLE HIV ANTIBODY (ROUTINE TESTING): HIV: NONREACTIVE

## 2021-10-06 ENCOUNTER — Other Ambulatory Visit: Payer: Self-pay

## 2021-10-06 ENCOUNTER — Emergency Department (HOSPITAL_BASED_OUTPATIENT_CLINIC_OR_DEPARTMENT_OTHER)
Admission: EM | Admit: 2021-10-06 | Discharge: 2021-10-06 | Disposition: A | Discharge status: Home / Self Care | Payer: 59 | Attending: Emergency Medicine | Admitting: Emergency Medicine

## 2021-10-06 ENCOUNTER — Encounter (HOSPITAL_BASED_OUTPATIENT_CLINIC_OR_DEPARTMENT_OTHER): Payer: Self-pay

## 2021-10-06 DIAGNOSIS — Z20822 Contact with and (suspected) exposure to covid-19: Secondary | ICD-10-CM | POA: Diagnosis not present

## 2021-10-06 DIAGNOSIS — O2343 Unspecified infection of urinary tract in pregnancy, third trimester: Secondary | ICD-10-CM | POA: Diagnosis present

## 2021-10-06 DIAGNOSIS — J069 Acute upper respiratory infection, unspecified: Secondary | ICD-10-CM

## 2021-10-06 DIAGNOSIS — Z87891 Personal history of nicotine dependence: Secondary | ICD-10-CM | POA: Diagnosis not present

## 2021-10-06 LAB — RESP PANEL BY RT-PCR (FLU A&B, COVID) ARPGX2
Influenza A by PCR: POSITIVE — AB
Influenza B by PCR: NEGATIVE
SARS Coronavirus 2 by RT PCR: NEGATIVE

## 2021-10-06 NOTE — Discharge Instructions (Signed)
Continue Tylenol for fever.  Follow-up your viral testing on your MyChart.

## 2021-10-06 NOTE — ED Provider Notes (Signed)
[facility] EMERGENCY DEPT Provider Note   CSN: [id number] Arrival date & time: 10/06/21  3154     History Chief Complaint  Patient presents with   Cough   Sore Throat   Fever   Nasal Congestion    Jennifer Hebert is a 30 y.o. female.  The history is provided by the patient.  Cough Cough characteristics:  Non-productive Sputum characteristics:  Nondescript Severity:  Mild Onset quality:  Gradual Duration:  3 days Timing:  Intermittent Progression:  Waxing and waning Chronicity:  New Context: upper respiratory infection   Relieved by:  Beta-agonist inhaler Associated symptoms: fever (none for over 24 hours)   Associated symptoms: no chest pain, no chills, no ear pain, no rash, no shortness of breath and no sore throat   Sore Throat Pertinent negatives include no chest pain, no abdominal pain and no shortness of breath.  Fever Associated symptoms: cough   Associated symptoms: no chest pain, no chills, no dysuria, no ear pain, no rash, no sore throat and no vomiting       Past Medical History:  Diagnosis Date   Anxiety    was on meds no longer - last use of anxiey meds in 06/2015    Complication of anesthesia    needs to taken off slowly   Family history of adverse reaction to anesthesia    mother slow to wake up after hysterectomy    GERD (gastroesophageal reflux disease)    Pregnancy induced hypertension     Patient Active Problem List   Diagnosis Date Noted   NSVD (normal spontaneous vaginal delivery) 10/10/2020   Pregnancy 10/09/2020   Staghorn calculus 04/22/2016    Past Surgical History:  Procedure Laterality Date   ANKLE SURGERY Right    CYSTOSCOPY W/ URETERAL STENT PLACEMENT Bilateral 04/22/2016   Procedure: CYSTOSCOPY WITH RETROGRADE PYELOGRAM/ RIGHT  URETERAL STENT PLACEMENT ;  Surgeon: Sebastian Ache, MD;  Location: [facility];  Service: Urology;  Laterality: Bilateral;   CYSTOSCOPY W/ URETERAL STENT PLACEMENT Bilateral 04/24/2016    Procedure: CYSTOSCOPY WITH STENT REPLACEMENT;  Surgeon: Sebastian Ache, MD;  Location: [facility];  Service: Urology;  Laterality: Bilateral;   CYSTOSCOPY WITH URETEROSCOPY Left 04/24/2016   Procedure: CYSTOSCOPY WITH URETEROSCOPY;  Surgeon: Sebastian Ache, MD;  Location: [facility];  Service: Urology;  Laterality: Left;   HOLMIUM LASER APPLICATION Bilateral 04/24/2016   Procedure: HOLMIUM LASER APPLICATION;  Surgeon: Sebastian Ache, MD;  Location: [facility];  Service: Urology;  Laterality: Bilateral;   NEPHROLITHOTOMY Right 04/22/2016   Procedure: 1ST STAGE NEPHROLITHOTOMY PERCUTANEOUS WITH SURGEON ACCESS;  Surgeon: Sebastian Ache, MD;  Location: [facility];  Service: Urology;  Laterality: Right;   NEPHROLITHOTOMY Right 04/24/2016   Procedure: 2ND STAGE NEPHROLITHOTOMY PERCUTANEOUS;  Surgeon: Sebastian Ache, MD;  Location: [facility];  Service: Urology;  Laterality: Right;   precancerous skin cancers removed      TONSILLECTOMY       OB History     Gravida  2   Para  1   Term  1   Preterm  0   AB  0   Living  1      SAB  0   IAB  0   Ectopic  0   Multiple      Live Births  1           Family History  Problem Relation Age of Onset   Stroke Mother    Stroke Maternal Aunt    Stroke Maternal Grandmother  Social History   Tobacco Use   Smoking status: Former    Types: Cigarettes    Quit date: 07/01/2015    Years since quitting: 6.2   Smokeless tobacco: Never  Vaping Use   Vaping Use: Former   Quit date: 12/25/2014  Substance Use Topics   Alcohol use: Not Currently    Alcohol/week: 0.0 standard drinks    Comment: socially    Drug use: Yes    Types: Marijuana    Comment: hx of 12/2014     Home Medications Prior to Admission medications   Medication Sig Start Date End Date Taking? Authorizing Provider  acetaminophen (TYLENOL) 325 MG tablet Take 650 mg by mouth every 6 (six) hours as needed for moderate pain or headache.    [provider]  ibuprofen (ADVIL) 600 MG  tablet Take 1 tablet (600 mg total) by mouth every 6 (six) hours as needed. 10/12/20   Everlene Farrier, MD  labetalol (NORMODYNE) 200 MG tablet Take 1 tablet (200 mg total) by mouth 2 (two) times daily. 10/12/20   Everlene Farrier, MD  senna-docusate (SENOKOT-S) 8.6-50 MG tablet Take 1 tablet by mouth 2 (two) times daily. While taking pain meds to prevent constipation 04/24/16   Alexis Frock, MD    Allergies    Patient has no known allergies.  Review of Systems   Review of Systems  Constitutional:  Positive for fever (none for over 24 hours). Negative for chills.  HENT:  Negative for ear pain and sore throat.   Eyes:  Negative for pain and visual disturbance.  Respiratory:  Positive for cough. Negative for shortness of breath.   Cardiovascular:  Negative for chest pain and palpitations.  Gastrointestinal:  Negative for abdominal pain and vomiting.  Genitourinary:  Negative for dysuria and hematuria.  Musculoskeletal:  Negative for arthralgias and back pain.  Skin:  Negative for color change and rash.  Neurological:  Negative for seizures and syncope.  All other systems reviewed and are negative.  Physical Exam Updated Vital Signs BP 135/84   Pulse (!) 106   Temp 98.1 F (36.7 C) (Oral)   Resp (!) 21   Ht 5\' 5"  (1.651 m)   Wt 120.7 kg   SpO2 97%   Breastfeeding No   BMI 44.26 kg/m   Physical Exam Vitals and nursing note reviewed.  Constitutional:      General: She is not in acute distress.    Appearance: She is well-developed.  HENT:     Head: Normocephalic and atraumatic.     Nose: No congestion.     Mouth/Throat:     Mouth: Mucous membranes are moist.     Pharynx: No oropharyngeal exudate or posterior oropharyngeal erythema.  Eyes:     Conjunctiva/sclera: Conjunctivae normal.  Cardiovascular:     Rate and Rhythm: Normal rate and regular rhythm.     Heart sounds: No murmur heard. Pulmonary:     Effort: Pulmonary effort is normal. No respiratory distress.      Breath sounds: Normal breath sounds.  Abdominal:     Palpations: Abdomen is soft.     Tenderness: There is no abdominal tenderness.  Musculoskeletal:        General: No swelling.     Cervical back: Neck supple.  Skin:    General: Skin is warm and dry.     Capillary Refill: Capillary refill takes less than 2 seconds.  Neurological:     Mental Status: She is alert.  Psychiatric:  Mood and Affect: Mood normal.    ED Results / Procedures / Treatments   Labs (all labs ordered are listed, but only abnormal results are displayed) Labs Reviewed  RESP PANEL BY RT-PCR (FLU A&B, COVID) [id number]    EKG None  Radiology No results found.  Procedures Procedures   Medications Ordered in ED Medications - No data to display  ED Course  I have reviewed the triage vital signs and the nursing notes.  Pertinent labs & imaging results that were available during my care of the patient were reviewed by me and considered in my medical decision making (see chart for details).    MDM Rules/Calculators/A&P                           ADELAIDE ROBYN is here with cough.  [redacted] weeks pregnant.  Unremarkable vitals.  No fever.  No issues with pregnancy at this time.  No abdominal pain, no pregnancy concerns.  Has had viral symptoms for the last several days.  Last had fever 2 days ago.  Child now sick with similar symptoms.  Overall she appears well.  Suspect resolving viral process.  Tested for COVID and flu.  Recommend continued use of Tylenol as needed.  Discharged in good condition.  This chart was dictated using voice recognition software.  Despite best efforts to proofread,  errors can occur which can change the documentation meaning.   Final Clinical Impression(s) / ED Diagnoses Final diagnoses:  Viral URI    Rx / DC Orders ED Discharge Orders     None        Lennice Sites, DO 10/06/21 [id number]

## 2021-10-06 NOTE — ED Triage Notes (Signed)
Pt. States having a sore throat, congestion, coughing, fever. Taking tylenol for fever. Pain from coughing 3/10 . Symptoms started Friday. Denies SHOB.

## 2021-10-26 NOTE — L&D Delivery Note (Signed)
PROCEDURE DATE: 12/05/2021 °  °PREOPERATIVE DIAGNOSIS: LGA, A1GDM °  °POSTOPERATIVE DIAGNOSIS: The same °  °PROCEDURE:    Primary Low Transverse Cesarean Section °  °SURGEON:  Dr Austin Jaisean Monteforte °  °INDICATIONS: This is a 30 yo G2P1001 at [redacted]w[redacted]d  wga requiring cesarean section secondary to LGA in the setting of gestational diabetes.  ° °Decision made to proceed with LTCS. The risks of cesarean section discussed with the patient included but were not limited to: bleeding which may require transfusion or reoperation; infection which may require antibiotics; injury to bowel, bladder, ureters or other surrounding organs; injury to the fetus; need for additional procedures including hysterectomy in the event of a life-threatening hemorrhage; placental abnormalities wth subsequent pregnancies, incisional problems, thromboembolic phenomenon and other postoperative/anesthesia complications. The patient agreed with the proposed plan, giving informed consent for the procedure.   °  °FINDINGS:  Viable female infant in vertex presentation, APGARspend,  Weight 10 lb 4 oz, Amniotic fluid clear,  Intact placenta, three vessel cord.  Grossly normal uterus  °.   °ANESTHESIA:    Epidural °ESTIMATED BLOOD LOSS: 594 cc °SPECIMENS: Placenta for routine °COMPLICATIONS: None immediate  °  °PROCEDURE IN DETAIL:  The patient received intravenous antibiotics (2g Ancef) and had sequential compression devices applied to her lower extremities while in the preoperative area.  She was then taken to the operating room where epidural anesthesia was dosed up to surgical level and was found to be adequate. She was then placed in a dorsal supine position with a leftward tilt, and prepped and draped in a sterile manner.  A foley catheter was placed into her bladder and attached to constant gravity.  After an adequate timeout was performed, a Pfannenstiel skin incision was made with scalpel and carried through to the underlying layer of fascia. The fascia  was incised in the midline and this incision was extended bilaterally using the Mayo scissors. Kocher clamps were applied to the superior aspect of the fascial incision and the underlying rectus muscles were dissected off bluntly. A similar process was carried out on the inferior aspect of the facial incision. The rectus muscles were separated in the midline bluntly and the peritoneum was entered bluntly.  A bladder flap was created sharply and developed bluntly. A transverse hysterotomy was made with a scalpel and extended bilaterally bluntly. The bladder blade was then removed. The infant was successfully delivered, and cord was clamped and cut and infant was handed over to awaiting neonatology team. Uterine massage was then administered and the placenta delivered intact with three-vessel cord. Cord gases were taken. The uterus was cleared of clot and debris.  The hysterotomy was closed with 0 vicryl.  A second imbricating suture of 0-vicryl was used to reinforce the incision and aid in hemostasis.The fascia was closed with 0-Vicryl in a running fashion with good restoration of anatomy.  The subcutaneus tissue was irrigated and was reapproximated using running plain gut stitches.  The skin was closed with 4-0 Vicryl in a subcuticular fashion. ° °All surgical site and was hemostatic at end of procedure without any further bleeding on exam.  °  °Pt tolerated the procedure well. All sponge/lap/needle counts were correct  X 2. Pt taken to recovery room in stable condition. °  ° °Austin Phillipe Clemon MD ° °

## 2021-11-13 LAB — OB RESULTS CONSOLE GBS: GBS: NEGATIVE

## 2021-11-21 ENCOUNTER — Encounter (HOSPITAL_COMMUNITY): Payer: Self-pay | Admitting: *Deleted

## 2021-11-21 ENCOUNTER — Telehealth (HOSPITAL_COMMUNITY): Payer: Self-pay | Admitting: *Deleted

## 2021-11-21 NOTE — Patient Instructions (Signed)
Jennifer Hebert  11/21/2021   Your procedure is scheduled on:  12/05/2021  Arrive at 0530 at Entrance C on [employer] [address] at [facility]  and [employer]. You are invited to use the FREE valet parking or use the Visitor's parking deck.  Pick up the phone at the desk and dial [phone].  Call this number if you have problems the morning of surgery: [phone]  Remember:   Do not eat food:(After Midnight) Desps de medianoche.  Do not drink clear liquids: (After Midnight) Desps de medianoche.  Take these medicines the morning of surgery with A SIP OF WATER:  none   Do not wear jewelry, make-up or nail polish.  Do not wear lotions, powders, or perfumes. Do not wear deodorant.  Do not shave 48 hours prior to surgery.  Do not bring valuables to the hospital.  [facility] is not   responsible for any belongings or valuables brought to the hospital.  Contacts, dentures or bridgework may not be worn into surgery.  Leave suitcase in the car. After surgery it may be brought to your room.  For patients admitted to the hospital, checkout time is 11:00 AM the day of              discharge.      Please read over the following fact sheets that you were given:     Preparing for Surgery

## 2021-11-21 NOTE — Telephone Encounter (Signed)
Preadmission screen  

## 2021-11-24 ENCOUNTER — Encounter (HOSPITAL_COMMUNITY): Payer: Self-pay

## 2021-11-28 ENCOUNTER — Encounter (HOSPITAL_COMMUNITY): Payer: Self-pay | Admitting: Obstetrics and Gynecology

## 2021-12-03 ENCOUNTER — Other Ambulatory Visit: Payer: Self-pay

## 2021-12-03 ENCOUNTER — Other Ambulatory Visit: Payer: Self-pay | Admitting: Obstetrics and Gynecology

## 2021-12-03 ENCOUNTER — Other Ambulatory Visit (HOSPITAL_COMMUNITY)
Admission: RE | Admit: 2021-12-03 | Discharge: 2021-12-03 | Disposition: A | Discharge status: Home / Self Care | Payer: 59 | Source: Ambulatory Visit | Attending: Obstetrics and Gynecology | Admitting: Obstetrics and Gynecology

## 2021-12-03 DIAGNOSIS — Z3A00 Weeks of gestation of pregnancy not specified: Secondary | ICD-10-CM | POA: Insufficient documentation

## 2021-12-03 DIAGNOSIS — Z3490 Encounter for supervision of normal pregnancy, unspecified, unspecified trimester: Secondary | ICD-10-CM | POA: Insufficient documentation

## 2021-12-03 HISTORY — DX: Gestational diabetes mellitus in pregnancy, unspecified control: O24.419

## 2021-12-03 LAB — TYPE AND SCREEN
ABO/RH(D): O POS
Antibody Screen: NEGATIVE

## 2021-12-03 LAB — CBC
HCT: 35.1 % — ABNORMAL LOW (ref 36.0–46.0)
Hemoglobin: 11.6 g/dL — ABNORMAL LOW (ref 12.0–15.0)
MCH: 27 pg (ref 26.0–34.0)
MCHC: 33 g/dL (ref 30.0–36.0)
MCV: 81.8 fL (ref 80.0–100.0)
Platelets: 217 10*3/uL (ref 150–400)
RBC: 4.29 MIL/uL (ref 3.87–5.11)
RDW: 15.7 % — ABNORMAL HIGH (ref 11.5–15.5)
WBC: 8.6 10*3/uL (ref 4.0–10.5)
nRBC: 0 % (ref 0.0–0.2)

## 2021-12-03 LAB — SARS CORONAVIRUS 2 (TAT 6-24 HRS): SARS Coronavirus 2: NEGATIVE

## 2021-12-03 LAB — RPR: RPR Ser Ql: NONREACTIVE

## 2021-12-04 ENCOUNTER — Encounter (HOSPITAL_COMMUNITY): Payer: Self-pay | Admitting: Obstetrics and Gynecology

## 2021-12-04 NOTE — Anesthesia Preprocedure Evaluation (Addendum)
Anesthesia Evaluation  Patient identified by MRN, date of birth, ID band Patient awake    Reviewed: Allergy & Precautions, NPO status , Patient's Chart, lab work & pertinent test results  History of Anesthesia Complications (+) PROLONGED EMERGENCE, Family history of anesthesia reaction and history of anesthetic complications  Airway Mallampati: III       Dental no notable dental hx. (+) Teeth Intact, Dental Advisory Given   Pulmonary former smoker,    Pulmonary exam normal breath sounds clear to auscultation       Cardiovascular hypertension, Normal cardiovascular exam Rhythm:Regular Rate:Normal     Neuro/Psych PSYCHIATRIC DISORDERS Anxiety negative neurological ROS     GI/Hepatic Neg liver ROS, GERD  Medicated,  Endo/Other  diabetes, GestationalMorbid obesityDiet  Renal/GU Renal diseaseHx/o renal calculus     Musculoskeletal negative musculoskeletal ROS (+)   Abdominal (+) + obese,   Peds  Hematology  (+) Blood dyscrasia, anemia ,   Anesthesia Other Findings   Reproductive/Obstetrics (+) Pregnancy Suspected fetal macrosomia                           Anesthesia Physical Anesthesia Plan  ASA: 3  Anesthesia Plan:    Post-op Pain Management:    Induction: Intravenous  PONV Risk Score and Plan: 4 or greater and Scopolamine patch - Pre-op  Airway Management Planned: Natural Airway  Additional Equipment:   Intra-op Plan:   Post-operative Plan:   Informed Consent: I have reviewed the patients History and Physical, chart, labs and discussed the procedure including the risks, benefits and alternatives for the proposed anesthesia with the patient or authorized representative who has indicated his/her understanding and acceptance.     Dental advisory given  Plan Discussed with: CRNA and Anesthesiologist  Anesthesia Plan Comments:        Anesthesia Quick Evaluation

## 2021-12-04 NOTE — H&P (Signed)
OB History and Physical   Jennifer Hebert is a 31 y.o. female G2P1001 presenting for primary C section at [redacted]w[redacted]d for LGA in the setting of GDM.  Pregnancy course notable for history of chronic hypertension previously on medicaitons, but has not required any antihypertensives throughout pregnancy. She was diagnosed with A1GDM with good control.  Ultrasound at 38 weeks notable for EFW 4402 g (>97%ile) and patient was counseled on mode of delivery and opts for primary C section.   Rh positive, GBS negative, panorama low risk female.   OB History     Gravida  2   Para  1   Term  1   Preterm  0   AB  0   Living  1      SAB  0   IAB  0   Ectopic  0   Multiple      Live Births  1          Past Medical History:  Diagnosis Date   ADHD    Anxiety    was on meds no longer - last use of anxiey meds in 06/2015    Complication of anesthesia    needs to taken off slowly   Family history of adverse reaction to anesthesia    mother slow to wake up after hysterectomy    GERD (gastroesophageal reflux disease)    Gestational diabetes    History of gonorrhea 2014   History of pelvic inflammatory disease 2014   Pregnancy induced hypertension    Past Surgical History:  Procedure Laterality Date   ANKLE SURGERY Right    CYSTOSCOPY W/ URETERAL STENT PLACEMENT Bilateral 04/22/2016   Procedure: CYSTOSCOPY WITH RETROGRADE PYELOGRAM/ RIGHT  URETERAL STENT PLACEMENT ;  Surgeon: Sebastian Ache, MD;  Location: [facility];  Service: Urology;  Laterality: Bilateral;   CYSTOSCOPY W/ URETERAL STENT PLACEMENT Bilateral 04/24/2016   Procedure: CYSTOSCOPY WITH STENT REPLACEMENT;  Surgeon: Sebastian Ache, MD;  [facility];  Service: Urology;  Laterality: Bilateral;   CYSTOSCOPY WITH URETEROSCOPY Left 04/24/2016   Procedure: CYSTOSCOPY WITH URETEROSCOPY;  Surgeon: Sebastian Ache, MD;  [facility];  Service: Urology;  Laterality: Left;   HOLMIUM LASER APPLICATION Bilateral 04/24/2016    Procedure: HOLMIUM LASER APPLICATION;  Surgeon: Sebastian Ache, MD;  [facility];  Service: Urology;  Laterality: Bilateral;   LITHOTRIPSY     NEPHROLITHOTOMY Right 04/22/2016   Procedure: 1ST STAGE NEPHROLITHOTOMY PERCUTANEOUS WITH SURGEON ACCESS;  Surgeon: Sebastian Ache, MD;  [facility];  Service: Urology;  Laterality: Right;   NEPHROLITHOTOMY Right 04/24/2016   Procedure: 2ND STAGE NEPHROLITHOTOMY PERCUTANEOUS;  Surgeon: Sebastian Ache, MD;  [facility];  Service: Urology;  Laterality: Right;   precancerous skin cancers removed      TONSILLECTOMY     Family History: family history includes Stroke in her maternal aunt, maternal grandmother, and mother. Social History:  reports that she quit smoking about 6 years ago. Her smoking use included cigarettes. She has never used smokeless tobacco. She reports that she does not currently use alcohol. She reports current drug use. Drug: Marijuana.     Maternal Diabetes: A2GDM Genetic Screening: Normal Maternal Ultrasounds/Referrals: Normal Fetal Ultrasounds or other Referrals:  None Maternal Substance Abuse:  No Significant Maternal Medications:  None Significant Maternal Lab Results:  Group B Strep negative Other Comments:  None  Review of Systems Denies fever, chills SOB, CP, N/V/D History   Blood pressure (!) 146/90, pulse 80, temperature 98.4 F (36.9 C),  temperature source Oral, resp. rate 15, height 5\' 5"  (1.651 m), weight 122.5 kg, SpO2 100 %, not currently breastfeeding. Exam Physical Exam  Gen: alert, well appearing, no distress Chest: nonlabored breathing CV: no peripheral edema Abdomen: soft, gravid Ext: no evidence of DVT   Prenatal labs: ABO, Rh: --/--/O POS (02/08 06-04-1976) Antibody: NEG (02/08 0916) Rubella: Immune (07/08 0000) RPR: NON REACTIVE (02/08 0915)  HBsAg: Negative (07/08 0000)  HIV: Non-reactive (07/08 0000)  GBS: Negative/-- (01/19 0000)   Assessment/Plan: Admit to Labor and  Delivery Patient counseled extensively in office regarding LGA in the setting of GDM and risk of shoulder dystocia. Patient prefers primary C section as mode of delivery. Patient was counseled on the risks of Cesarean delivery, which include but are not limited to bleeding, infection, damage to nearby organs including bowel/bladder/ureter, need for additional procedure or blood transfusion, and implications for future pregnancy.  Patient agreeable to procedure, all questions answered.  Will proceed as planned.   08-20[birth year]1998 12/05/2021, 7:21 AM

## 2021-12-05 ENCOUNTER — Encounter (HOSPITAL_COMMUNITY): Payer: Self-pay | Admitting: Obstetrics and Gynecology

## 2021-12-05 ENCOUNTER — Inpatient Hospital Stay (HOSPITAL_COMMUNITY): Payer: 59 | Admitting: Anesthesiology

## 2021-12-05 ENCOUNTER — Inpatient Hospital Stay (HOSPITAL_COMMUNITY)
Admission: RE | Admit: 2021-12-05 | Discharge: 2021-12-07 | DRG: 787 | Disposition: A | Discharge status: Home / Self Care | Payer: 59 | Attending: Obstetrics and Gynecology | Admitting: Obstetrics and Gynecology

## 2021-12-05 ENCOUNTER — Encounter (HOSPITAL_COMMUNITY)
Admission: RE | Disposition: A | Discharge status: Home / Self Care | Payer: Self-pay | Source: Home / Self Care | Attending: Obstetrics and Gynecology

## 2021-12-05 ENCOUNTER — Other Ambulatory Visit: Payer: Self-pay

## 2021-12-05 DIAGNOSIS — O24429 Gestational diabetes mellitus in childbirth, unspecified control: Secondary | ICD-10-CM

## 2021-12-05 DIAGNOSIS — Z87891 Personal history of nicotine dependence: Secondary | ICD-10-CM

## 2021-12-05 DIAGNOSIS — O3663X0 Maternal care for excessive fetal growth, third trimester, not applicable or unspecified: Secondary | ICD-10-CM | POA: Diagnosis present

## 2021-12-05 DIAGNOSIS — Z3490 Encounter for supervision of normal pregnancy, unspecified, unspecified trimester: Secondary | ICD-10-CM

## 2021-12-05 DIAGNOSIS — Z3A39 39 weeks gestation of pregnancy: Secondary | ICD-10-CM | POA: Diagnosis not present

## 2021-12-05 DIAGNOSIS — O9081 Anemia of the puerperium: Secondary | ICD-10-CM | POA: Diagnosis not present

## 2021-12-05 DIAGNOSIS — D62 Acute posthemorrhagic anemia: Secondary | ICD-10-CM | POA: Diagnosis not present

## 2021-12-05 DIAGNOSIS — O134 Gestational [pregnancy-induced] hypertension without significant proteinuria, complicating childbirth: Secondary | ICD-10-CM | POA: Diagnosis present

## 2021-12-05 DIAGNOSIS — O99214 Obesity complicating childbirth: Secondary | ICD-10-CM | POA: Diagnosis present

## 2021-12-05 DIAGNOSIS — O24420 Gestational diabetes mellitus in childbirth, diet controlled: Secondary | ICD-10-CM | POA: Diagnosis present

## 2021-12-05 HISTORY — DX: Attention-deficit hyperactivity disorder, unspecified type: F90.9

## 2021-12-05 LAB — GLUCOSE, CAPILLARY
Glucose-Capillary: 95 mg/dL (ref 70–99)
Glucose-Capillary: 104 mg/dL — ABNORMAL HIGH (ref 70–99)

## 2021-12-05 SURGERY — Surgical Case
Anesthesia: Regional

## 2021-12-05 MED ORDER — NALOXONE HCL 0.4 MG/ML IJ SOLN: 0.4000 mg | INTRAMUSCULAR | Status: DC | PRN

## 2021-12-05 MED ORDER — PHENYLEPHRINE HCL (PRESSORS) 10 MG/ML IV SOLN
INTRAVENOUS | Status: DC | PRN
  Administered 2021-12-05 (×2): 80 ug via INTRAVENOUS

## 2021-12-05 MED ORDER — OXYTOCIN-SODIUM CHLORIDE 30-0.9 UT/500ML-% IV SOLN: 2.5000 [IU]/h | INTRAVENOUS | Status: AC

## 2021-12-05 MED ORDER — MEPERIDINE HCL 25 MG/ML IJ SOLN: 6.2500 mg | INTRAMUSCULAR | Status: DC | PRN

## 2021-12-05 MED ORDER — NALOXONE HCL 4 MG/10ML IJ SOLN
1.0000 ug/kg/h | INTRAVENOUS | Status: DC | PRN
  Filled 2021-12-05: qty 5

## 2021-12-05 MED ORDER — DIPHENHYDRAMINE HCL 25 MG PO CAPS: 25.0000 mg | ORAL_CAPSULE | ORAL | Status: DC | PRN

## 2021-12-05 MED ORDER — DEXAMETHASONE SODIUM PHOSPHATE 4 MG/ML IJ SOLN
INTRAMUSCULAR | Status: DC | PRN
  Administered 2021-12-05: 8 mg via INTRAVENOUS

## 2021-12-05 MED ORDER — METOCLOPRAMIDE HCL 5 MG/ML IJ SOLN
INTRAMUSCULAR | Status: DC | PRN
  Administered 2021-12-05: 10 mg via INTRAVENOUS

## 2021-12-05 MED ORDER — SCOPOLAMINE 1 MG/3DAYS TD PT72
MEDICATED_PATCH | TRANSDERMAL | Status: AC
  Filled 2021-12-05: qty 1

## 2021-12-05 MED ORDER — LACTATED RINGERS IV SOLN
INTRAVENOUS | Status: DC | PRN
Start: 2021-12-05 — End: 2021-12-05

## 2021-12-05 MED ORDER — KETOROLAC TROMETHAMINE 30 MG/ML IJ SOLN
30.0000 mg | Freq: Four times a day (QID) | INTRAMUSCULAR | Status: AC | PRN
  Administered 2021-12-05 – 2021-12-06 (×3): 30 mg via INTRAVENOUS
  Filled 2021-12-05 (×3): qty 1

## 2021-12-05 MED ORDER — DIPHENHYDRAMINE HCL 50 MG/ML IJ SOLN: 12.5000 mg | INTRAMUSCULAR | Status: DC | PRN

## 2021-12-05 MED ORDER — KETOROLAC TROMETHAMINE 30 MG/ML IJ SOLN
30.0000 mg | Freq: Four times a day (QID) | INTRAMUSCULAR | Status: AC | PRN
  Administered 2021-12-05: 30 mg via INTRAMUSCULAR

## 2021-12-05 MED ORDER — CEFAZOLIN IN SODIUM CHLORIDE 3-0.9 GM/100ML-% IV SOLN: 3.0000 g | INTRAVENOUS | Status: DC

## 2021-12-05 MED ORDER — SCOPOLAMINE 1 MG/3DAYS TD PT72
1.0000 | MEDICATED_PATCH | TRANSDERMAL | Status: DC
  Administered 2021-12-05: 1.5 mg via TRANSDERMAL

## 2021-12-05 MED ORDER — ONDANSETRON HCL 4 MG/2ML IJ SOLN: 4.0000 mg | Freq: Three times a day (TID) | INTRAMUSCULAR | Status: DC | PRN

## 2021-12-05 MED ORDER — ZOLPIDEM TARTRATE 5 MG PO TABS: 5.0000 mg | ORAL_TABLET | Freq: Every evening | ORAL | Status: DC | PRN

## 2021-12-05 MED ORDER — METOCLOPRAMIDE HCL 5 MG/ML IJ SOLN
INTRAMUSCULAR | Status: AC
  Filled 2021-12-05: qty 2

## 2021-12-05 MED ORDER — ONDANSETRON HCL 4 MG/2ML IJ SOLN
INTRAMUSCULAR | Status: AC
  Filled 2021-12-05: qty 2

## 2021-12-05 MED ORDER — PHENYLEPHRINE HCL-NACL 20-0.9 MG/250ML-% IV SOLN
INTRAVENOUS | Status: DC | PRN
  Administered 2021-12-05: 60 ug/min via INTRAVENOUS

## 2021-12-05 MED ORDER — OXYTOCIN-SODIUM CHLORIDE 30-0.9 UT/500ML-% IV SOLN
INTRAVENOUS | Status: DC | PRN
  Administered 2021-12-05: 300 mL via INTRAVENOUS

## 2021-12-05 MED ORDER — DIPHENHYDRAMINE HCL 25 MG PO CAPS: 25.0000 mg | ORAL_CAPSULE | Freq: Four times a day (QID) | ORAL | Status: DC | PRN

## 2021-12-05 MED ORDER — OXYCODONE HCL 5 MG PO TABS
5.0000 mg | ORAL_TABLET | ORAL | Status: DC | PRN
  Administered 2021-12-06: 10 mg via ORAL
  Administered 2021-12-07: 5 mg via ORAL
  Filled 2021-12-05: qty 2
  Filled 2021-12-05: qty 1

## 2021-12-05 MED ORDER — SOD CITRATE-CITRIC ACID 500-334 MG/5ML PO SOLN
30.0000 mL | ORAL | Status: AC
  Administered 2021-12-05: 30 mL via ORAL

## 2021-12-05 MED ORDER — CEFAZOLIN IN SODIUM CHLORIDE 3-0.9 GM/100ML-% IV SOLN
INTRAVENOUS | Status: AC
  Filled 2021-12-05: qty 100

## 2021-12-05 MED ORDER — CEFAZOLIN SODIUM-DEXTROSE 2-4 GM/100ML-% IV SOLN
INTRAVENOUS | Status: AC
  Filled 2021-12-05: qty 100

## 2021-12-05 MED ORDER — COCONUT OIL OIL: 1.0000 "application " | TOPICAL_OIL | Status: DC | PRN

## 2021-12-05 MED ORDER — ACETAMINOPHEN 10 MG/ML IV SOLN
INTRAVENOUS | Status: DC | PRN
  Administered 2021-12-05: 1000 mg via INTRAVENOUS

## 2021-12-05 MED ORDER — PHENYLEPHRINE HCL-NACL 20-0.9 MG/250ML-% IV SOLN
INTRAVENOUS | Status: AC
  Filled 2021-12-05: qty 250

## 2021-12-05 MED ORDER — ACETAMINOPHEN 325 MG PO TABS
650.0000 mg | ORAL_TABLET | ORAL | Status: DC | PRN
  Administered 2021-12-05 – 2021-12-06 (×2): 650 mg via ORAL
  Filled 2021-12-05 (×2): qty 2

## 2021-12-05 MED ORDER — BUPIVACAINE IN DEXTROSE 0.75-8.25 % IT SOLN
INTRATHECAL | Status: DC | PRN
  Administered 2021-12-05: 1.6 mL via INTRATHECAL

## 2021-12-05 MED ORDER — OXYTOCIN-SODIUM CHLORIDE 30-0.9 UT/500ML-% IV SOLN
INTRAVENOUS | Status: AC
  Filled 2021-12-05: qty 500

## 2021-12-05 MED ORDER — IBUPROFEN 600 MG PO TABS
600.0000 mg | ORAL_TABLET | Freq: Four times a day (QID) | ORAL | Status: DC
  Administered 2021-12-06 – 2021-12-07 (×5): 600 mg via ORAL
  Filled 2021-12-05 (×5): qty 1

## 2021-12-05 MED ORDER — SODIUM CHLORIDE 0.9% FLUSH: 3.0000 mL | INTRAVENOUS | Status: DC | PRN

## 2021-12-05 MED ORDER — FENTANYL CITRATE (PF) 100 MCG/2ML IJ SOLN: 25.0000 ug | INTRAMUSCULAR | Status: DC | PRN

## 2021-12-05 MED ORDER — SENNOSIDES-DOCUSATE SODIUM 8.6-50 MG PO TABS
2.0000 | ORAL_TABLET | Freq: Every day | ORAL | Status: DC
  Administered 2021-12-06 – 2021-12-07 (×2): 2 via ORAL
  Filled 2021-12-05 (×3): qty 2

## 2021-12-05 MED ORDER — MORPHINE SULFATE (PF) 0.5 MG/ML IJ SOLN
INTRAMUSCULAR | Status: AC
  Filled 2021-12-05: qty 10

## 2021-12-05 MED ORDER — SIMETHICONE 80 MG PO CHEW
80.0000 mg | CHEWABLE_TABLET | Freq: Three times a day (TID) | ORAL | Status: DC
  Administered 2021-12-05 – 2021-12-07 (×7): 80 mg via ORAL
  Filled 2021-12-05 (×8): qty 1

## 2021-12-05 MED ORDER — PRENATAL MULTIVITAMIN CH
1.0000 | ORAL_TABLET | Freq: Every day | ORAL | Status: DC
  Administered 2021-12-06 – 2021-12-07 (×2): 1 via ORAL
  Filled 2021-12-05 (×2): qty 1

## 2021-12-05 MED ORDER — SIMETHICONE 80 MG PO CHEW
80.0000 mg | CHEWABLE_TABLET | ORAL | Status: DC | PRN
  Filled 2021-12-05: qty 1

## 2021-12-05 MED ORDER — TETANUS-DIPHTH-ACELL PERTUSSIS 5-2.5-18.5 LF-MCG/0.5 IM SUSY: 0.5000 mL | PREFILLED_SYRINGE | Freq: Once | INTRAMUSCULAR | Status: DC

## 2021-12-05 MED ORDER — SOD CITRATE-CITRIC ACID 500-334 MG/5ML PO SOLN
ORAL | Status: AC
  Filled 2021-12-05: qty 30

## 2021-12-05 MED ORDER — KETOROLAC TROMETHAMINE 30 MG/ML IJ SOLN
INTRAMUSCULAR | Status: AC
  Filled 2021-12-05: qty 1

## 2021-12-05 MED ORDER — DIBUCAINE (PERIANAL) 1 % EX OINT: 1.0000 "application " | TOPICAL_OINTMENT | CUTANEOUS | Status: DC | PRN

## 2021-12-05 MED ORDER — ACETAMINOPHEN 10 MG/ML IV SOLN
INTRAVENOUS | Status: AC
  Filled 2021-12-05: qty 100

## 2021-12-05 MED ORDER — MENTHOL 3 MG MT LOZG: 1.0000 | LOZENGE | OROMUCOSAL | Status: DC | PRN

## 2021-12-05 MED ORDER — FENTANYL CITRATE (PF) 100 MCG/2ML IJ SOLN
INTRAMUSCULAR | Status: AC
  Filled 2021-12-05: qty 2

## 2021-12-05 MED ORDER — DEXAMETHASONE SODIUM PHOSPHATE 4 MG/ML IJ SOLN
INTRAMUSCULAR | Status: AC
  Filled 2021-12-05: qty 2

## 2021-12-05 MED ORDER — ONDANSETRON HCL 4 MG/2ML IJ SOLN
INTRAMUSCULAR | Status: DC | PRN
  Administered 2021-12-05: 4 mg via INTRAVENOUS

## 2021-12-05 MED ORDER — PHENYLEPHRINE 40 MCG/ML (10ML) SYRINGE FOR IV PUSH (FOR BLOOD PRESSURE SUPPORT)
PREFILLED_SYRINGE | INTRAVENOUS | Status: AC
  Filled 2021-12-05: qty 10

## 2021-12-05 MED ORDER — LACTATED RINGERS IV SOLN: INTRAVENOUS | Status: DC

## 2021-12-05 MED ORDER — WITCH HAZEL-GLYCERIN EX PADS: 1.0000 "application " | MEDICATED_PAD | CUTANEOUS | Status: DC | PRN

## 2021-12-05 MED ORDER — STERILE WATER FOR IRRIGATION IR SOLN
Status: DC | PRN
  Administered 2021-12-05: 1

## 2021-12-05 MED ORDER — FENTANYL CITRATE (PF) 100 MCG/2ML IJ SOLN
INTRAMUSCULAR | Status: DC | PRN
  Administered 2021-12-05: 15 ug via INTRATHECAL

## 2021-12-05 MED ORDER — MORPHINE SULFATE (PF) 0.5 MG/ML IJ SOLN
INTRAMUSCULAR | Status: DC | PRN
  Administered 2021-12-05: .15 mg via INTRATHECAL

## 2021-12-05 MED ORDER — DEXTROSE 5 % IV SOLN
INTRAVENOUS | Status: DC | PRN
  Administered 2021-12-05: 3 g via INTRAVENOUS

## 2021-12-05 SURGICAL SUPPLY — 33 items
BENZOIN TINCTURE PRP APPL 2/3 (GAUZE/BANDAGES/DRESSINGS) ×1 IMPLANT
CHLORAPREP W/TINT 26ML (MISCELLANEOUS) ×2 IMPLANT
CLAMP CORD UMBIL (MISCELLANEOUS) IMPLANT
CLIP FILSHIE TUBAL LIGA STRL (Clip) IMPLANT
CLOTH BEACON ORANGE TIMEOUT ST (SAFETY) ×2 IMPLANT
DRSG OPSITE POSTOP 4X10 (GAUZE/BANDAGES/DRESSINGS) ×2 IMPLANT
ELECT REM PT RETURN 9FT ADLT (ELECTROSURGICAL) ×2
ELECTRODE REM PT RTRN 9FT ADLT (ELECTROSURGICAL) ×1 IMPLANT
EXTRACTOR VACUUM M CUP 4 TUBE (SUCTIONS) IMPLANT
GLOVE BIOGEL PI IND STRL 7.0 (GLOVE) ×2 IMPLANT
GLOVE BIOGEL PI IND STRL 7.5 (GLOVE) ×1 IMPLANT
GLOVE BIOGEL PI INDICATOR 7.0 (GLOVE) ×2
GLOVE BIOGEL PI INDICATOR 7.5 (GLOVE) ×1
GLOVE ECLIPSE 7.0 STRL STRAW (GLOVE) ×2 IMPLANT
GOWN STRL REUS W/TWL LRG LVL3 (GOWN DISPOSABLE) ×4 IMPLANT
KIT ABG SYR 3ML LUER SLIP (SYRINGE) IMPLANT
MAT PREVALON FULL STRYKER (MISCELLANEOUS) ×1 IMPLANT
NDL HYPO 25X5/8 SAFETYGLIDE (NEEDLE) IMPLANT
NEEDLE HYPO 25X5/8 SAFETYGLIDE (NEEDLE) IMPLANT
NS IRRIG 1000ML POUR BTL (IV SOLUTION) ×2 IMPLANT
PACK C SECTION WH (CUSTOM PROCEDURE TRAY) ×2 IMPLANT
PAD OB MATERNITY 4.3X12.25 (PERSONAL CARE ITEMS) ×2 IMPLANT
STRIP CLOSURE SKIN 1/2X4 (GAUZE/BANDAGES/DRESSINGS) ×1 IMPLANT
SUT PDS AB 0 CTX 60 (SUTURE) ×1 IMPLANT
SUT PLAIN 0 NONE (SUTURE) IMPLANT
SUT PLAIN 2 0 XLH (SUTURE) ×1 IMPLANT
SUT VIC AB 0 CT1 36 (SUTURE) ×2 IMPLANT
SUT VIC AB 0 CTX 36 (SUTURE) ×4
SUT VIC AB 0 CTX36XBRD ANBCTRL (SUTURE) ×2 IMPLANT
SUT VIC AB 4-0 KS 27 (SUTURE) ×2 IMPLANT
TOWEL OR 17X24 6PK STRL BLUE (TOWEL DISPOSABLE) ×2 IMPLANT
TRAY FOLEY W/BAG SLVR 14FR LF (SET/KITS/TRAYS/PACK) ×2 IMPLANT
WATER STERILE IRR 1000ML POUR (IV SOLUTION) ×2 IMPLANT

## 2021-12-05 NOTE — Anesthesia Postprocedure Evaluation (Signed)
Anesthesia Post Note  Patient: Jennifer Hebert  Procedure(s) Performed: PRIMARY CESAREAN SECTION EDC: 12-10-21 ALLERG: NKDA     Patient location during evaluation: PACU Anesthesia Type: General Level of consciousness: awake and alert and oriented Pain management: pain level controlled Vital Signs Assessment: post-procedure vital signs reviewed and stable Respiratory status: spontaneous breathing, nonlabored ventilation and respiratory function stable Cardiovascular status: blood pressure returned to baseline and stable Postop Assessment: no apparent nausea or vomiting, spinal receding, no headache, no backache and patient able to bend at knees Anesthetic complications: no   No notable events documented.  Last Vitals:  Vitals:   12/05/21 0930 12/05/21 0945  BP: (!) 119/96 133/88  Pulse: 78 81  Resp: 14 15  Temp:  36.7 C  SpO2: 97% 98%    Last Pain:  Vitals:   12/05/21 0945  TempSrc: Oral  PainSc: 0-No pain   Pain Goal:    LLE Motor Response: Purposeful movement (12/05/21 0945)   RLE Motor Response: Purposeful movement (12/05/21 0945)       Epidural/Spinal Function Cutaneous sensation: Able to Discern Pressure (12/05/21 0945), Patient able to flex knees: Yes (12/05/21 0945), Patient able to lift hips off bed: No (12/05/21 0945), Back pain beyond tenderness at insertion site: No (12/05/21 0945), Progressively worsening motor and/or sensory loss: No (12/05/21 0945), Bowel and/or bladder incontinence post epidural: No (12/05/21 0945)  Jailyne Chieffo A.

## 2021-12-05 NOTE — Transfer of Care (Signed)
Immediate Anesthesia Transfer of Care Note  Patient: Jennifer Hebert  Procedure(s) Performed: PRIMARY CESAREAN SECTION EDC: 12-10-21 ALLERG: NKDA  Patient Location: PACU  Anesthesia Type:Spinal  Level of Consciousness: awake, alert  and oriented  Airway & Oxygen Therapy: Patient Spontanous Breathing  Post-op Assessment: Report given to RN and Post -op Vital signs reviewed and stable  Post vital signs: Reviewed and stable  Last Vitals:  Vitals Value Taken Time  BP 116/63 12/05/21 0900  Temp    Pulse 77 12/05/21 0902  Resp 21 12/05/21 0902  SpO2 98 % 12/05/21 0902  Vitals shown include unvalidated device data.  Last Pain:  Vitals:   12/05/21 0605  TempSrc: Oral  PainSc: 0-No pain         Complications: No notable events documented.

## 2021-12-05 NOTE — Op Note (Signed)
PROCEDURE DATE: 12/05/2021   PREOPERATIVE DIAGNOSIS: LGA, A1GDM   POSTOPERATIVE DIAGNOSIS: The same   PROCEDURE:    Primary Low Transverse Cesarean Section   SURGEON:  Dr Nilda Simmer   INDICATIONS: This is a 31 yo G2P1001 at [redacted]w[redacted]d  wga requiring cesarean section secondary to LGA in the setting of gestational diabetes.   Decision made to proceed with LTCS. The risks of cesarean section discussed with the patient included but were not limited to: bleeding which may require transfusion or reoperation; infection which may require antibiotics; injury to bowel, bladder, ureters or other surrounding organs; injury to the fetus; need for additional procedures including hysterectomy in the event of a life-threatening hemorrhage; placental abnormalities wth subsequent pregnancies, incisional problems, thromboembolic phenomenon and other postoperative/anesthesia complications. The patient agreed with the proposed plan, giving informed consent for the procedure.     FINDINGS:  Viable female infant in vertex presentation, APGARspend,  Weight 10 lb 4 oz, Amniotic fluid clear,  Intact placenta, three vessel cord.  Grossly normal uterus  .   ANESTHESIA:    Epidural ESTIMATED BLOOD LOSS: 594 cc SPECIMENS: Placenta for routine COMPLICATIONS: None immediate    PROCEDURE IN DETAIL:  The patient received intravenous antibiotics (2g Ancef) and had sequential compression devices applied to her lower extremities while in the preoperative area.  She was then taken to the operating room where epidural anesthesia was dosed up to surgical level and was found to be adequate. She was then placed in a dorsal supine position with a leftward tilt, and prepped and draped in a sterile manner.  A foley catheter was placed into her bladder and attached to constant gravity.  After an adequate timeout was performed, a Pfannenstiel skin incision was made with scalpel and carried through to the underlying layer of fascia. The fascia  was incised in the midline and this incision was extended bilaterally using the Mayo scissors. Kocher clamps were applied to the superior aspect of the fascial incision and the underlying rectus muscles were dissected off bluntly. A similar process was carried out on the inferior aspect of the facial incision. The rectus muscles were separated in the midline bluntly and the peritoneum was entered bluntly.  A bladder flap was created sharply and developed bluntly. A transverse hysterotomy was made with a scalpel and extended bilaterally bluntly. The bladder blade was then removed. The infant was successfully delivered, and cord was clamped and cut and infant was handed over to awaiting neonatology team. Uterine massage was then administered and the placenta delivered intact with three-vessel cord. Cord gases were taken. The uterus was cleared of clot and debris.  The hysterotomy was closed with 0 vicryl.  A second imbricating suture of 0-vicryl was used to reinforce the incision and aid in hemostasis.The fascia was closed with 0-Vicryl in a running fashion with good restoration of anatomy.  The subcutaneus tissue was irrigated and was reapproximated using running plain gut stitches.  The skin was closed with 4-0 Vicryl in a subcuticular fashion.  All surgical site and was hemostatic at end of procedure without any further bleeding on exam.    Pt tolerated the procedure well. All sponge/lap/needle counts were correct  X 2. Pt taken to recovery room in stable condition.    Nilda Simmer MD

## 2021-12-05 NOTE — Anesthesia Procedure Notes (Signed)
Spinal  Patient location during procedure: OR Start time: 12/05/2021 7:28 AM End time: 12/05/2021 7:31 AM Reason for block: surgical anesthesia Staffing Performed: anesthesiologist  Anesthesiologist: Mal Amabile, MD Preanesthetic Checklist Completed: patient identified, IV checked, site marked, risks and benefits discussed, surgical consent, monitors and equipment checked, pre-op evaluation and timeout performed Spinal Block Patient position: sitting Prep: DuraPrep and site prepped and draped Patient monitoring: heart rate, cardiac monitor, continuous pulse ox and blood pressure Approach: midline Location: L3-4 Injection technique: single-shot Needle Needle type: Pencan  Needle gauge: 24 G Needle length: 9 cm Needle insertion depth: 7 cm Assessment Sensory level: T3 Events: CSF return Additional Notes Patient tolerated procedure well. Adequate sensory level.

## 2021-12-05 NOTE — Lactation Note (Signed)
This note was copied from a baby's chart. Lactation Consultation Note Mom BF her now 18 month old for 3 weeks and had to stop d/t getting Covid and her milk dried up. Mom has been using DEBP and getting colostrum giving back to baby. Mom stated first time she pumped she got 9 ml and second time she pumped she got 70ml.  Mom is latching baby well. Denies painful latch. LC made suggestion of body alignment. Newborn feeding habits, STS, I&O discussed. Mom encouraged to feed baby 8-12 times/24 hours and with feeding cues.   Encouraged mom to call for questions or concerns. Lactation brochure given.  Patient Name: Jennifer Hebert NATFT'D Date: 12/05/2021 Reason for consult: Initial assessment;Term;Maternal endocrine disorder Age:20 hours  Maternal Data Has patient been taught Hand Expression?: Yes Does the patient have breastfeeding experience prior to this delivery?: Yes How long did the patient breastfeed?: 3 weeks  Feeding    LATCH Score Latch: Grasps breast easily, tongue down, lips flanged, rhythmical sucking.  Audible Swallowing: A few with stimulation  Type of Nipple: Everted at rest and after stimulation  Comfort (Breast/Nipple): Soft / non-tender  Hold (Positioning): No assistance needed to correctly position infant at breast.  LATCH Score: 9   Lactation Tools Discussed/Used Tools: Pump Breast pump type: Double-Electric Breast Pump Reason for Pumping: mom requested DEBP to get her milk coming in and for supplementing. (RN set up) Pumping frequency: Q3hr  Interventions    Discharge    Consult Status Consult Status: Follow-up Date: 12/06/21 Follow-up type: In-patient    Charyl Dancer 12/05/2021, 10:17 PM

## 2021-12-06 ENCOUNTER — Encounter (HOSPITAL_COMMUNITY): Payer: Self-pay | Admitting: Obstetrics and Gynecology

## 2021-12-06 LAB — CBC
HCT: 29.1 % — ABNORMAL LOW (ref 36.0–46.0)
Hemoglobin: 9.3 g/dL — ABNORMAL LOW (ref 12.0–15.0)
MCH: 26.7 pg (ref 26.0–34.0)
MCHC: 32 g/dL (ref 30.0–36.0)
MCV: 83.6 fL (ref 80.0–100.0)
Platelets: 203 10*3/uL (ref 150–400)
RBC: 3.48 MIL/uL — ABNORMAL LOW (ref 3.87–5.11)
RDW: 15.7 % — ABNORMAL HIGH (ref 11.5–15.5)
WBC: 10.7 10*3/uL — ABNORMAL HIGH (ref 4.0–10.5)
nRBC: 0 % (ref 0.0–0.2)

## 2021-12-06 MED ORDER — DOCUSATE SODIUM 100 MG PO CAPS
100.0000 mg | ORAL_CAPSULE | Freq: Every day | ORAL | Status: DC
  Administered 2021-12-06 – 2021-12-07 (×2): 100 mg via ORAL
  Filled 2021-12-06 (×2): qty 1

## 2021-12-06 MED ORDER — FERROUS SULFATE 325 (65 FE) MG PO TABS
325.0000 mg | ORAL_TABLET | ORAL | Status: DC
  Administered 2021-12-06: 325 mg via ORAL
  Filled 2021-12-06: qty 1

## 2021-12-06 NOTE — Social Work (Signed)
CSW received consult for anxiety and ADHD. CSW also noted marijuana use in H&P. CSW met with MOB to offer support and complete assessment.   ° ° °CSW met with MOB with MOB at bedside and introduced CSW role. CSW observed MOB sitting up in bed and watching the baby exit the room for the circumcision procedure. MOB presented pleasant and welcomed CSW visit. CSW inquired how MOB has felt since giving birth. MOB reported feeling good. CSW inquired about MOB history of ADHD and anxiety. MOB acknowledged that she has ADHD and anxiety. She reported that she was diagnosed with ADHD at age 6 and managed without medications. MOB reported that she was diagnosed with anxiety in 2015 while in college as it related to her taking exams and was prescribed a low dose of Zoloft. MOB reported she has not had any concerns since then. MOB reported that she experienced baby blues with her older child but was able to cope. CSW provided education regarding the baby blues period vs. perinatal mood disorders, discussed treatment and gave resources for mental health follow up if concerns arise.  CSW recommended MOB complete a self-evaluation during the postpartum time period using the New Mom Checklist from Postpartum Progress and encouraged MOB to contact a medical professional if symptoms are noted at any time. MOB denied thoughts of harm to self and others. MOB identified her husband, mom and extended family, and friends as supports. MOB reported she feels comfortable reaching out to her provider if she has concerns. ° °CSW inquired if MOB used THC (marijuana) during the pregnancy as noted in chart. MOB denied substance use and reported that she had never spoken to any provider about her using marijuana. CSW informed MOB about the hospital drug screen policy and offered to talk with the nurse about her concerns. MOB was agreeable.  ° °MOB reported that she had essential items for the infant including a bassinet where the infant will sleep.  CSW provided review of Sudden Infant Death Syndrome (SIDS) precautions. MOB has chosen [facility] for the infant's follow up care.  ° ° °Per Provider note,  ° °Clarification from H&P: °  °In autopopulated Social History section, it states patient currently uses marijuana, however this not the case, and suspect that this information was entered in error at previous encounter.  °  °The accurate information is below in the H&P under "Maternal Substance Abuse" which lists "No". °  °This was again confirmed with patient today. ° ° °CSW identifies no further need for intervention and no barriers to discharge at this time.  ° °Allesha Aronoff, MSW, LCSW °[facility]  °Clinical Social Worker  °[phone] °12/06/2021  2:44 PM  °

## 2021-12-06 NOTE — Lactation Note (Signed)
This note was copied from a baby's chart. Lactation Consultation Note  Patient Name: Jennifer Hebert MBTDH'R Date: 12/06/2021 Reason for consult: Follow-up assessment;Term Age:31 hours   P2 mother whose infant is now 66 hours old.  This is a term baby at 39+2 weeks.  Mother breast fed her first child (now 68 months old) for 3 weeks before she got Covid and her milk supply diminished.  Her current feeding preference is breast.  Baby "Cillian" was circumcised this morning and has been sleepy.  Reviewed hand expression with mother and she was able to express colostrum drops which I finger fed to baby.  Assisted "Cillian" to latch easily, but he required constant stimulation to initiate sucking.  Observed him feeding on/off for 10 minutes before he became too sleepy.  Mother willing to pump now.  Observed her using the #24 flanges; mother aware of how to assess for correct flange size.  I anticipate she will need to increase to the #27 size soon.  Encouraged to feed 8-12 times/24 hours or sooner if baby shows feeding cues.  Mother will call for latch assistance as needed.  Suggested mother call for donor milk supplementation or formula if "Cillian" does not begin to awaken and feed better by this evening.  Mother verbalized understanding.  She will feed back any EBM she obtains to baby.  Grandmother and family friend present.  RN updated.   Maternal Data Has patient been taught Hand Expression?: Yes Does the patient have breastfeeding experience prior to this delivery?: Yes How long did the patient breastfeed?: 3 weeks  Feeding Mother's Current Feeding Choice: Breast Milk  LATCH Score Latch: Repeated attempts needed to sustain latch, nipple held in mouth throughout feeding, stimulation needed to elicit sucking reflex.  Audible Swallowing: None  Type of Nipple: Everted at rest and after stimulation  Comfort (Breast/Nipple): Soft / non-tender  Hold (Positioning): Assistance needed to  correctly position infant at breast and maintain latch.  LATCH Score: 6   Lactation Tools Discussed/Used Tools: Pump;Flanges Flange Size: 24;27 Breast pump type: Double-Electric Breast Pump;Manual Pump Education: Setup, frequency, and cleaning;Milk Storage (Reviewed) Reason for Pumping: Breast stimulation for supplementation; mother's request Pumping frequency: Every three hours  Interventions Interventions: Breast feeding basics reviewed;Assisted with latch;Skin to skin;Breast massage;Hand express;Breast compression;Adjust position;DEBP;Hand pump;Expressed milk;Position options;Support pillows;Education  Discharge Pump: Personal;Manual;DEBP  Consult Status Consult Status: Follow-up Date: 12/07/21 Follow-up type: In-patient    Dora Sims 12/06/2021, 3:27 PM

## 2021-12-06 NOTE — Progress Notes (Signed)
Clarification from H&P:  In autopopulated Social History section, it states patient currently uses marijuana, however this not the case, and suspect that this information was entered in error at previous encounter.   The accurate information is below in the H&P under "Maternal Substance Abuse" which lists "No".  This was again confirmed with patient today.   Jennifer Hebert

## 2021-12-06 NOTE — Progress Notes (Signed)
Postpartum Progress Note  Postpartum Day 1 s/p primary Cesarean section for LGA, GDM.  Subjective:  Patient reports no overnight events.  She reports well controlled pain, ambulating without difficulty, voiding spontaneously, tolerating PO.  She reports Positive flatus, Negative BM.  Vaginal bleeding is appropriate.  Objective: Blood pressure 131/74, pulse 89, temperature 98.3 F (36.8 C), temperature source Oral, resp. rate 16, height 5\' 5"  (1.651 m), weight 122.5 kg, SpO2 98 %, not currently breastfeeding.  Physical Exam:  General: alert and no distress Lochia: appropriate Uterine Fundus: firm Incision: dressing in place DVT Evaluation: No evidence of DVT seen on physical exam.  Recent Labs    12/03/21 0915 12/06/21 0404  HGB 11.6* 9.3*  HCT 35.1* 29.1*    Assessment/Plan: Postpartum Day 1, s/p C-section Acute blood loss anemia - Fe/Colace. No signs or symptoms of anemia.  Baby boy - desires circ. Will perform if cleared by nursery.  Lactation following Doing well, continue routine postpartum care. Anticipate discharge home tomorrow   LOS: 1 day   02/03/22 12/06/2021, 7:41 AM

## 2021-12-07 MED ORDER — FERROUS SULFATE 325 (65 FE) MG PO TABS: 325.0000 mg | ORAL_TABLET | ORAL | 3 refills | Status: AC

## 2021-12-07 MED ORDER — OXYCODONE HCL 5 MG PO TABS: 5.0000 mg | ORAL_TABLET | ORAL | 0 refills | Status: AC | PRN

## 2021-12-07 MED ORDER — DOCUSATE SODIUM 100 MG PO CAPS: 100.0000 mg | ORAL_CAPSULE | Freq: Every day | ORAL | 0 refills | Status: AC

## 2021-12-07 MED ORDER — IBUPROFEN 600 MG PO TABS: 600.0000 mg | ORAL_TABLET | Freq: Four times a day (QID) | ORAL | 0 refills | Status: AC

## 2021-12-07 MED ORDER — ACETAMINOPHEN 325 MG PO TABS: 650.0000 mg | ORAL_TABLET | ORAL | 0 refills | Status: AC | PRN

## 2021-12-07 NOTE — Discharge Summary (Signed)
Obstetric Discharge Summary  Jennifer Hebert is a 31 y.o. female that presented on 12/05/2021 for scheduled primary C section for LGA and GDM. Her procedure was uncomplicated and she delivered a viable female infant on 12/05/2021.  Her postpartum course was uncomplicated and on PPD#2, she reported well controlled pain, spontaneous voiding, ambulating without difficulty, and tolerating PO.  She was stable for discharge home on 12/07/2021 with plans for in-office follow up.  Hemoglobin  Date Value Ref Range Status  12/06/2021 9.3 (L) 12.0 - 15.0 g/dL Final   HCT  Date Value Ref Range Status  12/06/2021 29.1 (L) 36.0 - 46.0 % Final    Physical Exam:  General: alert and no distress Lochia: appropriate Uterine Fundus: firm Incision: healing well DVT Evaluation: No evidence of DVT seen on physical exam.  Discharge Diagnoses: Term Pregnancy-delivered  Discharge Information: Date: 12/07/2021 Activity: Pelvic rest, as tolerated Diet: routine Medications: Tylenol, motrin, oxycodone, iron sulfate, colace Condition: stable Instructions: Refer to practice specific booklet.  Discussed prior to discharge.  Discharge to: Home  Follow-up Information     [place] Follow up.   Why: Please follow up for 6 week postpartum visit. Contact information: [address] [place] [address] [phone]                 Newborn Data: Live born female  Birth Weight: 10 lb 4.4 oz (4660 g) APGAR: 8, 9  Newborn Delivery   Birth date/time: 12/05/2021 08:01:00 Delivery type: C-Section, Low Transverse Trial of labor: No C-section categorization: Primary      Home with mother.  Lyn Henri 12/07/2021, 6:57 PM

## 2021-12-07 NOTE — Lactation Note (Signed)
This note was copied from a baby's chart. Lactation Consultation Note  Patient Name: Jennifer Hebert ZCHYI'F Date: 12/07/2021 Reason for consult: Follow-up assessment Age:31 hours   P2 mother whose infant is now 18 hours old.  This is a term baby at 39+2 weeks.  Mother's current feeding plan is breast/formula.  Mother had no questions/concerns related to breast feeding.  She feels like "Jennifer Hebert" is latching and feeding well.  She is supplementing with formula until her milk is in full supply.  She increased to the #27 size flange as discussed yesterday; last volume obtained with pumping was 14 mls.  Praised mother for her efforts.  She is awaiting the pediatrician for a discharge and has our OP phone number for any further concerns.    No support person present at this time.   Maternal Data    Feeding    LATCH Score                    Lactation Tools Discussed/Used    Interventions    Discharge Discharge Education: Engorgement and breast care  Consult Status Consult Status: Complete Date: 12/07/21 Follow-up type: Call as needed    Lexis Potenza R Chanika Byland 12/07/2021, 10:52 AM

## 2021-12-07 NOTE — Plan of Care (Signed)
Discharge teaching and instructions given, pt receptive.

## 2021-12-07 NOTE — Progress Notes (Signed)
Postpartum Progress Note  Postpartum Day 2 s/p primary Cesarean section for LGA, GDM.  Subjective:  Patient reports no overnight events.  She reports well controlled pain, ambulating without difficulty, voiding spontaneously, tolerating PO.  She reports Positive flatus, Negative BM.  Vaginal bleeding is appropriate.  Objective: Blood pressure 124/86, pulse 90, temperature 97.9 F (36.6 C), temperature source Oral, resp. rate 17, height 5\' 5"  (1.651 m), weight 122.5 kg, SpO2 99 %, not currently breastfeeding.  Physical Exam:  General: alert and no distress Lochia: appropriate Uterine Fundus: firm Incision: dressing in place DVT Evaluation: No evidence of DVT seen on physical exam.  Recent Labs    12/06/21 0404  HGB 9.3*  HCT 29.1*     Assessment/Plan: Postpartum Day 2, s/p C-section Acute blood loss anemia - Fe/Colace. No signs or symptoms of anemia.  Transient HTN with exacerbated pain.  Improved after routine PO meds given and BP now normal  No s/s preeclampsia. Baby boy - s/p circ.  Lactation following Doing well, continue routine postpartum care. Anticipate discharge home today   LOS: 2 days   02/03/22 12/07/2021, 8:01 AM

## 2021-12-16 ENCOUNTER — Telehealth (HOSPITAL_COMMUNITY): Payer: Self-pay

## 2021-12-16 NOTE — Telephone Encounter (Signed)
No answer. Left message to return nurse call.  Marcelino Duster [facility] 12/16/2021,1349
